# Patient Record
Sex: Male | Born: 1995 | Race: White | Hispanic: Yes | Marital: Single | Smoking: Never smoker
Health system: Southern US, Community
[De-identification: ages and names within clinical notes are randomized; demographics above are authoritative.]

## PROBLEM LIST (undated history)

## (undated) ENCOUNTER — Ambulatory Visit: Source: Home / Self Care

## (undated) DIAGNOSIS — J45909 Unspecified asthma, uncomplicated: Secondary | ICD-10-CM

## (undated) DIAGNOSIS — U071 COVID-19: Secondary | ICD-10-CM

## (undated) DIAGNOSIS — R238 Other skin changes: Secondary | ICD-10-CM

## (undated) HISTORY — DX: Other skin changes: R23.8

## (undated) HISTORY — DX: Unspecified asthma, uncomplicated: J45.909

## (undated) HISTORY — DX: COVID-19: U07.1

---
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
## 2015-01-15 ENCOUNTER — Ambulatory Visit
Admission: RE | Admit: 2015-01-15 | Discharge: 2015-01-15 | Disposition: A | Discharge status: Home / Self Care | Payer: PRIVATE HEALTH INSURANCE | Source: Ambulatory Visit | Attending: Occupational Medicine | Admitting: Occupational Medicine

## 2015-01-15 ENCOUNTER — Other Ambulatory Visit: Payer: Self-pay | Admitting: Occupational Medicine

## 2015-01-15 DIAGNOSIS — Z021 Encounter for pre-employment examination: Secondary | ICD-10-CM

## 2017-09-21 DIAGNOSIS — M25511 Pain in right shoulder: Secondary | ICD-10-CM | POA: Insufficient documentation

## 2018-08-18 ENCOUNTER — Ambulatory Visit (INDEPENDENT_AMBULATORY_CARE_PROVIDER_SITE_OTHER): Payer: Managed Care, Other (non HMO) | Admitting: Allergy

## 2018-08-18 ENCOUNTER — Encounter: Payer: Self-pay | Admitting: Allergy

## 2018-08-18 VITALS — BP 126/86 | HR 73 | Resp 16 | Ht 63.5 in | Wt 171.6 lb

## 2018-08-18 DIAGNOSIS — H1013 Acute atopic conjunctivitis, bilateral: Secondary | ICD-10-CM

## 2018-08-18 DIAGNOSIS — J3089 Other allergic rhinitis: Secondary | ICD-10-CM | POA: Insufficient documentation

## 2018-08-18 MED ORDER — FLUTICASONE PROPIONATE 50 MCG/ACT NA SUSP: 2.0000 | Freq: Every day | NASAL | 5 refills | Status: DC

## 2018-08-18 MED ORDER — MONTELUKAST SODIUM 10 MG PO TABS: 10.0000 mg | ORAL_TABLET | Freq: Every day | ORAL | 5 refills | Status: DC

## 2018-08-18 MED ORDER — AZELASTINE HCL 0.1 % NA SOLN: NASAL | 5 refills | Status: DC

## 2018-08-18 NOTE — Assessment & Plan Note (Signed)
Perennial rhinoconjunctivitis symptoms for the past 7 years which worsened during the spring and summer.  He has tried over-the-counter Claritin, Zyrtec and Flonase with some benefit.  No previous allergy testing or ENT work-up.  Today skin testing was positive to grass, tree, weed, ragweed, dust mites and mold.  Discussed environmental control measures.  Start Singulair 10mg  daily.  Start Flonase 2 sprays daily for nasal congestion.  Demonstrated proper nasal spray use.  Start azelastine 1-2 sprays twice a day as needed for runny nose.   May use over the counter antihistamines such as Zyrtec (cetirizine), Claritin (loratadine), Allegra (fexofenadine), or Xyzal (levocetirizine) daily as needed.  Nasal saline spray (i.e., Simply Saline) or nasal saline lavage (i.e., NeilMed) is recommended as needed and prior to medicated nasal sprays.  Had a detailed discussion with patient/family that clinical history is suggestive of allergic rhinitis, and may benefit from allergy immunotherapy (AIT). Discussed in detail regarding the dosing, schedule, side effects (mild to moderate local allergic reaction and rarely systemic allergic reactions including anaphylaxis), and benefits (significant improvement in nasal symptoms, seasonal flares of asthma) of immunotherapy with the patient. There is significant time commitment involved with allergy shots, which includes weekly immunotherapy injections for first 6 months and then biweekly to monthly injections for 3-5 years.   Patient would let me know if ready to start allergy injections.  If above regimen does not help his congestion symptoms then may also need an ENT evaluation as well.

## 2018-08-18 NOTE — Patient Instructions (Addendum)
Today's skin testing showed: positive to grass, tree, weeds, dust mites, ragweed, mold  Start Singulair 10mg  daily. Start Flonase 2 sprays daily for nasal congestion.  Start azelastine 1-2 sprays twice a day as needed for runny nose.  May use over the counter antihistamines such as Zyrtec (cetirizine), Claritin (loratadine), Allegra (fexofenadine), or Xyzal (levocetirizine) daily as needed.  Nasal saline spray (i.e., Simply Saline) or nasal saline lavage (i.e., NeilMed) is recommended as needed and prior to medicated nasal sprays.  Had a detailed discussion with patient/family that clinical history is suggestive of allergic rhinitis, and may benefit from allergy immunotherapy (AIT). Discussed in detail regarding the dosing, schedule, side effects (mild to moderate local allergic reaction and rarely systemic allergic reactions including anaphylaxis), and benefits (significant improvement in nasal symptoms, seasonal flares of asthma) of immunotherapy with the patient. There is significant time commitment involved with allergy shots, which includes weekly immunotherapy injections for first 6 months and then biweekly to monthly injections for 3-5 years.   Follow up in 2 months  Reducing Pollen Exposure . Pollen seasons: trees (spring), grass (summer) and ragweed/weeds (fall). Marland Kitchen Keep windows closed in your home and car to lower pollen exposure.  Lilian Kapur air conditioning in the bedroom and throughout the house if possible.  . Avoid going out in dry windy days - especially early morning. . Pollen counts are highest between 5 - 10 AM and on dry, hot and windy days.  . Save outside activities for late afternoon or after a heavy rain, when pollen levels are lower.  . Avoid mowing of grass if you have grass pollen allergy. Marland Kitchen Be aware that pollen can also be transported indoors on people and pets.  . Dry your clothes in an automatic dryer rather than hanging them outside where they might collect pollen.   . Rinse hair and eyes before bedtime. Control of House Dust Mite Allergen . Dust mite allergens are a common trigger of allergy and asthma symptoms. While they can be found throughout the house, these microscopic creatures thrive in warm, humid environments such as bedding, upholstered furniture and carpeting. . Because so much time is spent in the bedroom, it is essential to reduce mite levels there.  . Encase pillows, mattresses, and box springs in special allergen-proof fabric covers or airtight, zippered plastic covers.  . Bedding should be washed weekly in hot water (130 F) and dried in a hot dryer. Allergen-proof covers are available for comforters and pillows that can't be regularly washed.  Reyes Ivan the allergy-proof covers every few months. Minimize clutter in the bedroom. Keep pets out of the bedroom.  Marland Kitchen Keep humidity less than 50% by using a dehumidifier or air conditioning. You can buy a humidity measuring device called a hygrometer to monitor this.  . If possible, replace carpets with hardwood, linoleum, or washable area rugs. If that's not possible, vacuum frequently with a vacuum that has a HEPA filter. . Remove all upholstered furniture and non-washable window drapes from the bedroom. . Remove all non-washable stuffed toys from the bedroom.  Wash stuffed toys weekly. Mold Control Mold and fungi can grow on a variety of surfaces provided certain temperature and moisture conditions exist.  Outdoor molds grow on plants, decaying vegetation and soil. The major outdoor mold, Alternaria and Cladosporium, are found in very high numbers during hot and dry conditions. Generally, a late summer - fall peak is seen for common outdoor fungal spores. Rain will temporarily lower outdoor mold spore count, but counts  rise rapidly when the rainy period ends. The most important indoor molds are Aspergillus and Penicillium. Dark, humid and poorly ventilated basements are ideal sites for mold growth. The  next most common sites of mold growth are the bathroom and the kitchen. Outdoor (Seasonal) Mold Control Use air conditioning and keep windows closed. Avoid exposure to decaying vegetation. Avoid leaf raking. Avoid grain handling. Consider wearing a face mask if working in moldy areas.  Indoor (Perennial) Mold Control  Maintain humidity below 50%. Get rid of mold growth on hard surfaces with water, detergent and, if necessary, 5% bleach (do not mix with other cleaners). Then dry the area completely. If mold covers an area more than 10 square feet, consider hiring an indoor environmental professional. For clothing, washing with soap and water is best. If moldy items cannot be cleaned and dried, throw them away. Remove sources e.g. contaminated carpets. Repair and seal leaking roofs or pipes. Using dehumidifiers in damp basements may be helpful, but empty the water and clean units regularly to prevent mildew from forming. All rooms, especially basements, bathrooms and kitchens, require ventilation and cleaning to deter mold and mildew growth. Avoid carpeting on concrete or damp floors, and storing items in damp areas.

## 2018-08-18 NOTE — Progress Notes (Signed)
New Patient Note  RE: Ronald Vang MRN: [id number] DOB: 1995-08-24 Date of Office Visit: 08/18/2018  Referring provider: No ref. provider found Primary care provider: Patient, No Pcp Per  Chief Complaint: Allergic Rhinitis   History of Present Illness: I had the pleasure of seeing Ronald Vang for initial evaluation at the [facility] on 08/18/2018. He is a 23 y.o. male, who is self-referred here for the evaluation of allergic rhinitis. He is accompanied today by his girlfriend who provided/contributed to the history.   He reports symptoms of nasal congestion, sneezing, rhinorrhea, itchy nose, itchy/watery eyes and snoring at night. Symptoms have been going on for 7 years. The symptoms are present all year around with worsening in spring and summer. Other triggers include exposure to unknown. Anosmia: no. Headache: sometimes. He has used Careers adviserallegra, Claritin, zyrtec, Flonase with some improvement in symptoms. Sinus infections: no. Previous work up includes: no. Previous ENT evaluation: no. Previous sinus imaging: no.  Assessment and Plan: [name] is a 23 y.o. male with: Other allergic rhinitis Perennial rhinoconjunctivitis symptoms for the past 7 years which worsened during the spring and summer.  He has tried over-the-counter Claritin, Zyrtec and Flonase with some benefit.  No previous allergy testing or ENT work-up.  Today skin testing was positive to grass, tree, weed, ragweed, dust mites and mold.  Discussed environmental control measures.  Start Singulair 10mg  daily.  Start Flonase 2 sprays daily for nasal congestion.  Demonstrated proper nasal spray use.  Start azelastine 1-2 sprays twice a day as needed for runny nose.   May use over the counter antihistamines such as Zyrtec (cetirizine), Claritin (loratadine), Allegra (fexofenadine), or Xyzal (levocetirizine) daily as needed.  Nasal saline spray (i.e., Simply Saline) or nasal saline lavage (i.e., NeilMed)  is recommended as needed and prior to medicated nasal sprays.  Had a detailed discussion with patient/family that clinical history is suggestive of allergic rhinitis, and may benefit from allergy immunotherapy (AIT). Discussed in detail regarding the dosing, schedule, side effects (mild to moderate local allergic reaction and rarely systemic allergic reactions including anaphylaxis), and benefits (significant improvement in nasal symptoms, seasonal flares of asthma) of immunotherapy with the patient. There is significant time commitment involved with allergy shots, which includes weekly immunotherapy injections for first 6 months and then biweekly to monthly injections for 3-5 years.   Patient would let me know if ready to start allergy injections.  If above regimen does not help his congestion symptoms then may also need an ENT evaluation as well.  Allergic conjunctivitis of both eyes See assessment and plan as above.  Declines eyedrops at this time.  Return in about 2 months (around 10/17/2018).  Meds ordered this encounter  Medications  . montelukast (SINGULAIR) 10 MG tablet    Sig: Take 1 tablet (10 mg total) by mouth at bedtime.    Dispense:  30 tablet    Refill:  5  . fluticasone (FLONASE) 50 MCG/ACT nasal spray    Sig: Place 2 sprays into both nostrils daily.    Dispense:  16 g    Refill:  5  . azelastine (ASTELIN) 0.1 % nasal spray    Sig: Take 1-2 sprays twice a day as needed for runny nose.    Dispense:  30 mL    Refill:  5   Other allergy screening: Asthma: no Rhino conjunctivitis: yes Food allergy: no Medication allergy: no Hymenoptera allergy: no Urticaria: no Eczema:no History of recurrent infections suggestive of immunodeficency: no  Diagnostics: Skin Testing: Environmental allergy panel. Positive test to: grass, tree, weeds, dust mites, ragweed, mold.  Results discussed with patient/family. Airborne Adult Perc - 08/18/18 1349    Time Antigen Placed  1350     Allergen Manufacturer  Waynette Buttery    Location  Back    Number of Test  59    Panel 1  Select    1. Control-Buffer 50% Glycerol  Negative    2. Control-Histamine 1 mg/ml  3+    3. Albumin saline  Negative    4. Bahia  Negative    5. [place]  4+    6. Johnson  4+    7. Kentucky Blue  4+    8. Meadow Fescue  4+    9. Perennial Rye  4+    10. Sweet Vernal  4+    11. Timothy  4+    12. Cocklebur  Negative    13. Burweed Marshelder  Negative    14. Ragweed, short  Negative    15. Ragweed, Giant  Negative    16. Plantain,  English  4+    17. Lamb's Quarters  Negative    18. Sheep Sorrell  Negative    19. Rough Pigweed  Negative    20. Marsh Elder, Rough  Negative    21. Mugwort, Common  Negative    22. Ash mix  Negative    23. Birch mix  3+    24. Beech American  Negative    25. Box, Elder  Negative    26. Cedar, red  4+    27. Cottonwood, Guinea-Bissau  Negative    28. Elm mix  Negative    29. Hickory mix  4+    30. Maple mix  Negative    31. Oak, Guinea-Bissau mix  Negative    32. Pecan Pollen  4+    33. Pine mix  Negative    34. Sycamore Eastern  Negative    35. Walnut, Black Pollen  Negative    36. Alternaria alternata  Negative    37. Cladosporium Herbarum  Negative    38. Aspergillus mix  Negative    39. Penicillium mix  Negative    40. Bipolaris sorokiniana (Helminthosporium)  Negative    41. Drechslera spicifera (Curvularia)  Negative    42. Mucor plumbeus  Negative    43. Fusarium moniliforme  Negative    44. Aureobasidium pullulans (pullulara)  Negative    45. Rhizopus oryzae  Negative    46. Botrytis cinera  Negative    47. Epicoccum nigrum  Negative    48. Phoma betae  Negative    49. Candida Albicans  Negative    50. Trichophyton mentagrophytes  Negative    51. Mite, D Farinae  5,000 AU/ml  3+    52. Mite, D Pteronyssinus  5,000 AU/ml  3+    53. Cat Hair 10,000 BAU/ml  Negative    54.  Dog Epithelia  Negative    55. Mixed Feathers  Negative    56. Horse Epithelia   Negative    57. Cockroach, German  Negative    58. Mouse  Negative    59. Tobacco Leaf  Negative     Intradermal - 08/18/18 1433    Control  Negative    Ragweed mix  2+    Mold 1  Negative    Mold 2  2+    Mold 3  2+    Mold 4  2+  Cat  Negative    Dog  Negative    Cockroach  Negative       Past Medical History: Patient Active Problem List   Diagnosis Date Noted  . Other allergic rhinitis 08/18/2018  . Allergic conjunctivitis of both eyes 08/18/2018  . Routine general medical examination at a health care facility 01/17/2014  . Other general medical examination for administrative purposes 01/17/2014   History reviewed. No pertinent past medical history. Past Surgical History: History reviewed. No pertinent surgical history. Medication List:  Current Outpatient Medications  Medication Sig Dispense Refill  . cetirizine (ZYRTEC) 10 MG tablet Take 10 mg by mouth daily.    . fexofenadine (ALLEGRA) 180 MG tablet Take 180 mg by mouth daily.    Marland Kitchen. azelastine (ASTELIN) 0.1 % nasal spray Take 1-2 sprays twice a day as needed for runny nose. 30 mL 5  . fluticasone (FLONASE) 50 MCG/ACT nasal spray Place 2 sprays into both nostrils daily. 16 g 5  . montelukast (SINGULAIR) 10 MG tablet Take 1 tablet (10 mg total) by mouth at bedtime. 30 tablet 5   No current facility-administered medications for this visit.    Allergies: No Known Allergies Social History: Social History   Socioeconomic History  . Marital status: Single    Spouse name: Not on file  . Number of children: Not on file  . Years of education: Not on file  . Highest education level: Not on file  Occupational History  . Not on file  Social Needs  . Financial resource strain: Not on file  . Food insecurity:    Worry: Not on file    Inability: Not on file  . Transportation needs:    Medical: Not on file    Non-medical: Not on file  Tobacco Use  . Smoking status: Current Every Day Smoker    Types: Cigarettes  .  Smokeless tobacco: Never Used  Substance and Sexual Activity  . Alcohol use: Yes    Comment: social  . Drug use: No  . Sexual activity: Not on file  Lifestyle  . Physical activity:    Days per week: Not on file    Minutes per session: Not on file  . Stress: Not on file  Relationships  . Social connections:    Talks on phone: Not on file    Gets together: Not on file    Attends religious service: Not on file    Active member of club or organization: Not on file    Attends meetings of clubs or organizations: Not on file    Relationship status: Not on file  Other Topics Concern  . Not on file  Social History Narrative  . Not on file   Lives in a 23 year old home. Smoking: denies Occupation: Ecologistfirefighter  Environmental HistorySurveyor, minerals: Water Damage/mildew in the house: no Engineer, civil (consulting)Carpet in the family room: no Carpet in the bedroom: no Heating: gas and electric Cooling: central Pet: yes 2 dogs x 5 yrs  Family History: History reviewed. No pertinent family history. Problem                               Relation Asthma                                   No  Eczema  No  Food allergy                          No  Allergic rhino conjunctivitis     Mother   Review of Systems  Constitutional: Negative for appetite change, chills, fever and unexpected weight change.  HENT: Positive for congestion and rhinorrhea.   Eyes: Negative for itching.  Respiratory: Negative for cough, chest tightness, shortness of breath and wheezing.   Cardiovascular: Negative for chest pain.  Gastrointestinal: Negative for abdominal pain.  Genitourinary: Negative for difficulty urinating.  Skin: Negative for rash.  Allergic/Immunologic: Positive for environmental allergies. Negative for food allergies.  Neurological: Negative for headaches.   Objective: BP 126/86   Pulse 73   Resp 16   Ht 5' 3.5" (1.613 m)   Wt 171 lb 9.6 oz (77.8 kg)   SpO2 97%   BMI 29.92 kg/m  Body mass index  is 29.92 kg/m. Physical Exam  Constitutional: He is oriented to person, place, and time. He appears well-developed and well-nourished.  HENT:  Head: Normocephalic and atraumatic.  Right Ear: External ear normal.  Left Ear: External ear normal.  Nose: Mucosal edema and rhinorrhea present.  Mouth/Throat: Oropharynx is clear and moist.  Eyes: Conjunctivae and EOM are normal.  Neck: Neck supple.  Cardiovascular: Normal rate, regular rhythm and normal heart sounds. Exam reveals no gallop and no friction rub.  No murmur heard. Pulmonary/Chest: Effort normal and breath sounds normal. He has no wheezes. He has no rales.  Abdominal: Soft. Bowel sounds are normal. There is no abdominal tenderness.  Lymphadenopathy:    He has no cervical adenopathy.  Neurological: He is alert and oriented to person, place, and time.  Skin: Skin is warm. No rash noted.  Psychiatric: He has a normal mood and affect. His behavior is normal.  Nursing note and vitals reviewed.  The plan was reviewed with the patient/family, and all questions/concerned were addressed.  It was my pleasure to see Jonanthan today and participate in his care. Please feel free to contact me with any questions or concerns.  Sincerely,  Wyline Mood, DO Allergy & Immunology  [facility] office: [phone] [facility] office: [phone]

## 2018-10-17 ENCOUNTER — Ambulatory Visit: Payer: Managed Care, Other (non HMO) | Admitting: Allergy

## 2018-10-17 ENCOUNTER — Encounter: Payer: Self-pay | Admitting: Allergy

## 2018-10-17 ENCOUNTER — Other Ambulatory Visit: Payer: Self-pay

## 2018-10-17 VITALS — BP 110/70 | HR 64 | Resp 16 | Wt 171.0 lb

## 2018-10-17 DIAGNOSIS — H1010 Acute atopic conjunctivitis, unspecified eye: Secondary | ICD-10-CM | POA: Diagnosis not present

## 2018-10-17 DIAGNOSIS — J302 Other seasonal allergic rhinitis: Secondary | ICD-10-CM

## 2018-10-17 DIAGNOSIS — J3089 Other allergic rhinitis: Secondary | ICD-10-CM

## 2018-10-17 NOTE — Patient Instructions (Signed)
Other allergic rhinitis  Last skin testing was positive to grass, tree, weed, ragweed, dust mites and mold.    Continue environmental control measures.  Continue Singulair 10mg  daily - only once a day.   Continue Flonase 2 sprays daily for nasal congestion.    Start azelastine 1-2 sprays twice a day as needed for runny nose.   May use over the counter antihistamines such as Zyrtec (cetirizine), Claritin (loratadine), Allegra (fexofenadine), or Xyzal (levocetirizine) daily as needed. May take it twice a day if needed.   Nasal saline spray (i.e., Simply Saline) or nasal saline lavage (i.e., NeilMed) is recommended as needed and prior to medicated nasal sprays.  Let me know if ready to start allergy injections.  Let me know if you need eye drops.   Follow up in 4 months

## 2018-10-17 NOTE — Progress Notes (Signed)
Follow Up Note  RE: Demetrics Uren MRN: [id number] DOB: 20-Jun-1996 Date of Office Visit: 10/17/2018  Referring provider: No ref. provider found Primary care provider: Patient, No Pcp Per  Chief Complaint: Allergic Rhinitis   History of Present Illness: I had the pleasure of seeing Claudius Waldner for a follow up visit at the [facility] on 10/17/2018. He is a 23 y.o. male, who is being followed for allergic rhino conjunctivitis. Today he is here for regular follow up visit. His previous allergy office visit was on 08/18/2018 with Dr. Selena Batten.   Other allergic rhinitis Currently on Singulair daily, Claritin daily,  Flonase 2 sprays daily with no nosebleeds. Not needed to use azelastine nasal spray.   Using OTC eye drops with some benefit. Symptoms are fairly well controlled with above regimen.  Accidentally he has been doubling up on Singulair on bad days.  Noticed some increased symptoms after mowing the grass outdoors.  Assessment and Plan: Kijon is a 23 y.o. male with: Seasonal and perennial allergic rhinoconjunctivitis Past history - Perennial rhinoconjunctivitis symptoms for the past 7 years which worsened during the spring and summer.  He has tried over-the-counter Claritin, Zyrtec and Flonase with some benefit.  No previous allergy testing or ENT work-up. 2020 skin testing was positive to grass, tree, weed, ragweed, dust mites and mold.   Interim history - symptoms pretty well controlled with below regimen.   Continue environmental control measures.  Continue Singulair 10mg  daily - only once a day.   Continue Flonase 1-2 sprays daily for nasal congestion.    Start azelastine 1-2 sprays twice a day as needed for runny nose.   May use over the counter antihistamines such as Zyrtec (cetirizine), Claritin (loratadine), Allegra (fexofenadine), or Xyzal (levocetirizine) daily as needed. May take it twice a day if needed.   Nasal saline spray (i.e., Simply  Saline) or nasal saline lavage (i.e., NeilMed) is recommended as needed and prior to medicated nasal sprays.  Patient will let me know if ready to start allergy injections.  Declines Rx eye drops.   Return in about 4 months (around 02/16/2019).  Diagnostics: None.  Medication List:  Current Outpatient Medications  Medication Sig Dispense Refill  . azelastine (ASTELIN) 0.1 % nasal spray Take 1-2 sprays twice a day as needed for runny nose. 30 mL 5  . cetirizine (ZYRTEC) 10 MG tablet Take 10 mg by mouth daily.    . fexofenadine (ALLEGRA) 180 MG tablet Take 180 mg by mouth daily.    . fluticasone (FLONASE) 50 MCG/ACT nasal spray Place 2 sprays into both nostrils daily. 16 g 5  . montelukast (SINGULAIR) 10 MG tablet Take 1 tablet (10 mg total) by mouth at bedtime. 30 tablet 5   No current facility-administered medications for this visit.    Allergies: No Known Allergies I reviewed his past medical history, social history, family history, and environmental history and no significant changes have been reported from previous visit on 08/18/2018.  Review of Systems  Constitutional: Negative for appetite change, chills, fever and unexpected weight change.  HENT: Positive for congestion and rhinorrhea.   Eyes: Positive for itching.  Respiratory: Negative for cough, chest tightness, shortness of breath and wheezing.   Cardiovascular: Negative for chest pain.  Gastrointestinal: Negative for abdominal pain.  Genitourinary: Negative for difficulty urinating.  Skin: Negative for rash.  Allergic/Immunologic: Positive for environmental allergies. Negative for food allergies.  Neurological: Negative for headaches.   Objective: BP 110/70   Pulse  64   Resp 16   Wt 171 lb (77.6 kg)   SpO2 98%   BMI 29.82 kg/m  Body mass index is 29.82 kg/m. Physical Exam  Constitutional: He is oriented to person, place, and time. He appears well-developed and well-nourished.  HENT:  Head: Normocephalic  and atraumatic.  Right Ear: External ear normal.  Left Ear: External ear normal.  Nose: Mucosal edema and rhinorrhea present.  Mouth/Throat: Oropharynx is clear and moist.  Eyes: Conjunctivae and EOM are normal.  Neck: Neck supple.  Cardiovascular: Normal rate, regular rhythm and normal heart sounds. Exam reveals no gallop and no friction rub.  No murmur heard. Pulmonary/Chest: Effort normal and breath sounds normal. He has no wheezes. He has no rales.  Abdominal: Soft.  Neurological: He is alert and oriented to person, place, and time.  Skin: Skin is warm. No rash noted.  Psychiatric: He has a normal mood and affect. His behavior is normal.  Nursing note and vitals reviewed.  Previous notes and tests were reviewed. The plan was reviewed with the patient/family, and all questions/concerned were addressed.  It was my pleasure to see [name] today and participate in his care. Please feel free to contact me with any questions or concerns.  Sincerely,  Wyline MoodYoon Kim, DO Allergy & Immunology  [facility] [place] office: [phone] [facility] office: [phone]

## 2018-10-17 NOTE — Assessment & Plan Note (Signed)
Past history - Perennial rhinoconjunctivitis symptoms for the past 7 years which worsened during the spring and summer.  He has tried over-the-counter Claritin, Zyrtec and Flonase with some benefit.  No previous allergy testing or ENT work-up. 2020 skin testing was positive to grass, tree, weed, ragweed, dust mites and mold.   Interim history - symptoms pretty well controlled with below regimen.   Continue environmental control measures.  Continue Singulair 10mg  daily - only once a day.   Continue Flonase 1-2 sprays daily for nasal congestion.    Start azelastine 1-2 sprays twice a day as needed for runny nose.   May use over the counter antihistamines such as Zyrtec (cetirizine), Claritin (loratadine), Allegra (fexofenadine), or Xyzal (levocetirizine) daily as needed. May take it twice a day if needed.   Nasal saline spray (i.e., Simply Saline) or nasal saline lavage (i.e., NeilMed) is recommended as needed and prior to medicated nasal sprays.  Patient will let me know if ready to start allergy injections.  Declines Rx eye drops.

## 2019-02-20 ENCOUNTER — Ambulatory Visit: Payer: Managed Care, Other (non HMO) | Admitting: Allergy

## 2019-02-20 NOTE — Progress Notes (Deleted)
Follow Up Note  RE: Ronald Vang Basurto MRN: [id number] DOB: [birth year] Date of Office Visit: 02/20/2019  Referring provider: No ref. provider found Primary care provider: Patient, No Pcp Per  Chief Complaint: No chief complaint on file.  History of Present Illness: I had the pleasure of seeing Ronald Vang for a follow up visit at the [facility] on 02/20/2019. He is a 23 y.o. male, who is being followed for allergic rhinoconjunctivitis. Today he is here for regular follow up visit. His previous allergy office visit was on 10/17/2018 with Dr. Selena BattenKim.   Seasonal and perennial allergic rhinoconjunctivitis Past history - Perennial rhinoconjunctivitis symptoms for the past 7 years which worsened during the spring and summer.  He has tried over-the-counter Claritin, Zyrtec and Flonase with some benefit.  No previous allergy testing or ENT work-up. 2020 skin testing was positive to grass, tree, weed, ragweed, dust mites and mold.   Interim history - symptoms pretty well controlled with below regimen.   Continue environmental control measures.  Continue Singulair 10mg  daily - only once a day.   Continue Flonase 1-2 sprays daily for nasal congestion.  Start azelastine 1-2 sprays twice a day as needed for runny nose.   May use over the counter antihistamines such as Zyrtec (cetirizine), Claritin (loratadine), Allegra (fexofenadine), or Xyzal (levocetirizine) daily as needed. May take it twice a day if needed.   Nasal saline spray (i.e., Simply Saline) or nasal saline lavage (i.e., NeilMed) is recommended as needed and prior to medicated nasal sprays.  Patient will let me know if ready to start allergy injections.  Declines Rx eye drops.   Return in about 4 months (around 02/16/2019).  Assessment and Plan: [name] is a 23 y.o. male with: No problem-specific Assessment & Plan notes found for this encounter.  No follow-ups on file.  No orders of the defined types were  placed in this encounter.  Lab Orders  No laboratory test(s) ordered today    Diagnostics: Spirometry:  Tracings reviewed. His effort: {Blank single:19197::"Good reproducible efforts.","It was hard to get consistent efforts and there is a question as to whether this reflects a maximal maneuver.","Poor effort, data can not be interpreted."} FVC: ***L FEV1: ***L, ***% predicted FEV1/FVC ratio: ***% Interpretation: {Blank single:19197::"Spirometry consistent with mild obstructive disease","Spirometry consistent with moderate obstructive disease","Spirometry consistent with severe obstructive disease","Spirometry consistent with possible restrictive disease","Spirometry consistent with mixed obstructive and restrictive disease","Spirometry uninterpretable due to technique","Spirometry consistent with normal pattern","No overt abnormalities noted given today's efforts"}.  Please see scanned spirometry results for details.  Skin Testing: {Blank single:19197::"Select foods","Environmental allergy panel","Environmental allergy panel and select foods","Food allergy panel","None","Deferred due to recent antihistamines use"}. Positive test to: ***. Negative test to: ***.  Results discussed with patient/family.   Medication List:  Current Outpatient Medications  Medication Sig Dispense Refill  . azelastine (ASTELIN) 0.1 % nasal spray Take 1-2 sprays twice a day as needed for runny nose. 30 mL 5  . cetirizine (ZYRTEC) 10 MG tablet Take 10 mg by mouth daily.    . fexofenadine (ALLEGRA) 180 MG tablet Take 180 mg by mouth daily.    . fluticasone (FLONASE) 50 MCG/ACT nasal spray Place 2 sprays into both nostrils daily. 16 g 5  . montelukast (SINGULAIR) 10 MG tablet Take 1 tablet (10 mg total) by mouth at bedtime. 30 tablet 5   No current facility-administered medications for this visit.    Allergies: No Known Allergies I reviewed his past medical history, social history, family history,  and  environmental history and no significant changes have been reported from previous visit on 10/17/2018.  Review of Systems  Constitutional: Negative for appetite change, chills, fever and unexpected weight change.  HENT: Positive for congestion and rhinorrhea.   Eyes: Positive for itching.  Respiratory: Negative for cough, chest tightness, shortness of breath and wheezing.   Cardiovascular: Negative for chest pain.  Gastrointestinal: Negative for abdominal pain.  Genitourinary: Negative for difficulty urinating.  Skin: Negative for rash.  Allergic/Immunologic: Positive for environmental allergies. Negative for food allergies.  Neurological: Negative for headaches.   Objective: There were no vitals taken for this visit. There is no height or weight on file to calculate BMI. Physical Exam  Constitutional: He is oriented to person, place, and time. He appears well-developed and well-nourished.  HENT:  Head: Normocephalic and atraumatic.  Right Ear: External ear normal.  Left Ear: External ear normal.  Nose: Mucosal edema and rhinorrhea present.  Mouth/Throat: Oropharynx is clear and moist.  Eyes: Conjunctivae and EOM are normal.  Neck: Neck supple.  Cardiovascular: Normal rate, regular rhythm and normal heart sounds. Exam reveals no gallop and no friction rub.  No murmur heard. Pulmonary/Chest: Effort normal and breath sounds normal. He has no wheezes. He has no rales.  Abdominal: Soft.  Neurological: He is alert and oriented to person, place, and time.  Skin: Skin is warm. No rash noted.  Psychiatric: He has a normal mood and affect. His behavior is normal.  Nursing note and vitals reviewed.  Previous notes and tests were reviewed. The plan was reviewed with the patient/family, and all questions/concerned were addressed.  It was my pleasure to see Ronald Vang today and participate in his care. Please feel free to contact me with any questions or concerns.  Sincerely,  Rexene Alberts, DO  Allergy & Immunology  [facility] office: [phone] [facility] office: [place] office: [phone]

## 2019-03-01 ENCOUNTER — Other Ambulatory Visit: Payer: Self-pay

## 2019-03-01 ENCOUNTER — Encounter: Payer: Self-pay | Admitting: Allergy

## 2019-03-01 ENCOUNTER — Ambulatory Visit: Payer: Managed Care, Other (non HMO) | Admitting: Allergy

## 2019-03-01 VITALS — BP 112/72 | HR 88 | Temp 98.4°F | Resp 17

## 2019-03-01 DIAGNOSIS — J3089 Other allergic rhinitis: Secondary | ICD-10-CM | POA: Diagnosis not present

## 2019-03-01 DIAGNOSIS — H1010 Acute atopic conjunctivitis, unspecified eye: Secondary | ICD-10-CM | POA: Diagnosis not present

## 2019-03-01 DIAGNOSIS — J302 Other seasonal allergic rhinitis: Secondary | ICD-10-CM | POA: Diagnosis not present

## 2019-03-01 NOTE — Patient Instructions (Addendum)
Seasonal and perennial allergic rhinoconjunctivitis Past history -  2020 skin testing was positive to grass, tree, weed, ragweed, dust mites and mold.    Continue environmental control measures.  Continue montelukast 10mg  daily at night.   Continue Flonase 1-2 sprays daily for nasal congestion as needed.   Continue azelastine 1-2 sprays twice a day as needed for runny nose.   May use over the counter antihistamines such as Zyrtec (cetirizine), Claritin (loratadine), Allegra (fexofenadine), or Xyzal (levocetirizine) daily as needed. May take it twice a day if needed.   Nasal saline spray (i.e., Simply Saline) or nasal saline lavage (i.e., NeilMed) is recommended as needed and prior to medicated nasal sprays.  Make sure you take all the above medications during the spring time.   Follow up in 6 months or sooner if needed.   Reducing Pollen Exposure . Pollen seasons: trees (spring), grass (summer) and ragweed/weeds (fall). Marland Kitchen Keep windows closed in your home and car to lower pollen exposure.  Susa Simmonds air conditioning in the bedroom and throughout the house if possible.  . Avoid going out in dry windy days - especially early morning. . Pollen counts are highest between 5 - 10 AM and on dry, hot and windy days.  . Save outside activities for late afternoon or after a heavy rain, when pollen levels are lower.  . Avoid mowing of grass if you have grass pollen allergy. Marland Kitchen Be aware that pollen can also be transported indoors on people and pets.  . Dry your clothes in an automatic dryer rather than hanging them outside where they might collect pollen.  . Rinse hair and eyes before bedtime.

## 2019-03-01 NOTE — Progress Notes (Signed)
Follow Up Note  RE: Ronald Vang MRN: [id number] DOB: [birth year] Date of Office Visit: 03/01/2019  Referring provider: No ref. provider found Primary care provider: Patient, No Pcp Per  Chief Complaint: Allergic Rhinitis   History of Present Illness: I had the pleasure of seeing Alando Colleran for a follow up visit at the [facility] on 03/01/2019. He is a 23 y.o. male, who is being followed for allergic rhinoconjunctivitis. Today he is here for regular follow up visit. His previous allergy office visit was on 10/17/2018 with Dr. Maudie Mercury.   Seasonal and perennial allergic rhinoconjunctivitis Currently on Singulair daily and azelastine as needed with good benefit. Symptoms usually worst in the spring.  Wants to see how his symptoms are during next spring before deciding whether to start allergy injections.   Assessment and Plan: Kingslee is a 23 y.o. male with: Seasonal and perennial allergic rhinoconjunctivitis Past history - Perennial rhinoconjunctivitis symptoms for the past 7 years which worsened during the spring and summer.  No previous allergy testing or ENT work-up. 2020 skin testing was positive to grass, tree, weed, ragweed, dust mites and mold.   Interim history - well controlled with Singulair and azelastine prn. Worst in the spring.   Continue environmental control measures.  Continue montelukast 10mg  daily at night.   Continue Flonase 1-2 sprays daily for nasal congestion as needed.   Continue azelastine 1-2 sprays twice a day as needed for runny nose.   May use over the counter antihistamines such as Zyrtec (cetirizine), Claritin (loratadine), Allegra (fexofenadine), or Xyzal (levocetirizine) daily as needed. May take it twice a day if needed.   Nasal saline spray (i.e., Simply Saline) or nasal saline lavage (i.e., NeilMed) is recommended as needed and prior to medicated nasal sprays.  Make sure you take all the above medications during the spring  time.   If above regimen not effective next spring then will discuss starting on allergy immunotherapy.   Return in about 6 months (around 08/29/2019).  Diagnostics: None.  Medication List:  Current Outpatient Medications  Medication Sig Dispense Refill  . azelastine (ASTELIN) 0.1 % nasal spray Take 1-2 sprays twice a day as needed for runny nose. 30 mL 5  . montelukast (SINGULAIR) 10 MG tablet Take 1 tablet (10 mg total) by mouth at bedtime. 30 tablet 5   No current facility-administered medications for this visit.    Allergies: No Known Allergies I reviewed his past medical history, social history, family history, and environmental history and no significant changes have been reported from previous visit on 10/17/2018.  Review of Systems  Constitutional: Negative for appetite change, chills, fever and unexpected weight change.  HENT: Negative for congestion and rhinorrhea.   Eyes: Negative for itching.  Respiratory: Negative for cough, chest tightness, shortness of breath and wheezing.   Cardiovascular: Negative for chest pain.  Gastrointestinal: Negative for abdominal pain.  Genitourinary: Negative for difficulty urinating.  Skin: Negative for rash.  Allergic/Immunologic: Positive for environmental allergies. Negative for food allergies.  Neurological: Negative for headaches.   Objective: BP 112/72 (BP Location: Left Arm, Patient Position: Sitting, Cuff Size: Normal)   Pulse 88   Temp 98.4 F (36.9 C) (Temporal)   Resp 17   SpO2 98%  There is no height or weight on file to calculate BMI. Physical Exam  Constitutional: He is oriented to person, place, and time. He appears well-developed and well-nourished.  HENT:  Head: Normocephalic and atraumatic.  Right Ear: External ear normal.  Left Ear: External ear normal.  Nose: Mucosal edema (on the left) present.  Mouth/Throat: Oropharynx is clear and moist.  Eyes: Conjunctivae and EOM are normal.  Neck: Neck supple.   Cardiovascular: Normal rate, regular rhythm and normal heart sounds. Exam reveals no gallop and no friction rub.  No murmur heard. Pulmonary/Chest: Effort normal and breath sounds normal. He has no wheezes. He has no rales.  Abdominal: Soft.  Neurological: He is alert and oriented to person, place, and time.  Skin: Skin is warm. No rash noted.  Psychiatric: He has a normal mood and affect. His behavior is normal.  Nursing note and vitals reviewed.  Previous notes and tests were reviewed. The plan was reviewed with the patient/family, and all questions/concerned were addressed.  It was my pleasure to see [name] today and participate in his care. Please feel free to contact me with any questions or concerns.  Sincerely,  Wyline MoodYoon Maikol Grassia, DO Allergy & Immunology  [facility] [place] office: [phone] [facility] office: [phone] [place] office: [phone]

## 2019-03-01 NOTE — Assessment & Plan Note (Signed)
Past history - Perennial rhinoconjunctivitis symptoms for the past 7 years which worsened during the spring and summer.  No previous allergy testing or ENT work-up. 2020 skin testing was positive to grass, tree, weed, ragweed, dust mites and mold.   Interim history - well controlled with Singulair and azelastine prn. Worst in the spring.   Continue environmental control measures.  Continue montelukast 10mg  daily at night.   Continue Flonase 1-2 sprays daily for nasal congestion as needed.   Continue azelastine 1-2 sprays twice a day as needed for runny nose.   May use over the counter antihistamines such as Zyrtec (cetirizine), Claritin (loratadine), Allegra (fexofenadine), or Xyzal (levocetirizine) daily as needed. May take it twice a day if needed.   Nasal saline spray (i.e., Simply Saline) or nasal saline lavage (i.e., NeilMed) is recommended as needed and prior to medicated nasal sprays.  Make sure you take all the above medications during the spring time.   If above regimen not effective next spring then will discuss starting on allergy immunotherapy.

## 2019-08-30 ENCOUNTER — Ambulatory Visit: Payer: Managed Care, Other (non HMO) | Admitting: Allergy

## 2020-01-09 ENCOUNTER — Telehealth: Payer: Self-pay | Admitting: Allergy

## 2020-01-09 MED ORDER — AZELASTINE HCL 0.1 % NA SOLN: NASAL | 0 refills | Status: DC

## 2020-01-09 MED ORDER — MONTELUKAST SODIUM 10 MG PO TABS: 10.0000 mg | ORAL_TABLET | Freq: Every day | ORAL | 0 refills | Status: DC

## 2020-01-09 NOTE — Telephone Encounter (Signed)
Patient called and made appointment for 01/29/2020. And needs to have singulair, astelin called into [facility]. [phone].

## 2020-01-09 NOTE — Telephone Encounter (Signed)
Courtesy refill sent and patient notified. 

## 2020-01-29 ENCOUNTER — Ambulatory Visit: Payer: Managed Care, Other (non HMO) | Admitting: Family

## 2020-01-31 ENCOUNTER — Emergency Department (HOSPITAL_COMMUNITY): Payer: Managed Care, Other (non HMO)

## 2020-01-31 ENCOUNTER — Emergency Department (HOSPITAL_COMMUNITY)
Admission: EM | Admit: 2020-01-31 | Discharge: 2020-01-31 | Disposition: A | Discharge status: Home / Self Care | Payer: Managed Care, Other (non HMO) | Attending: Emergency Medicine | Admitting: Emergency Medicine

## 2020-01-31 ENCOUNTER — Other Ambulatory Visit: Payer: Self-pay | Admitting: Infectious Diseases

## 2020-01-31 ENCOUNTER — Encounter (HOSPITAL_COMMUNITY): Payer: Self-pay | Admitting: Emergency Medicine

## 2020-01-31 ENCOUNTER — Telehealth: Payer: Self-pay | Admitting: Infectious Diseases

## 2020-01-31 DIAGNOSIS — R05 Cough: Secondary | ICD-10-CM | POA: Diagnosis present

## 2020-01-31 DIAGNOSIS — U071 COVID-19: Secondary | ICD-10-CM | POA: Insufficient documentation

## 2020-01-31 DIAGNOSIS — Z6830 Body mass index (BMI) 30.0-30.9, adult: Secondary | ICD-10-CM

## 2020-01-31 DIAGNOSIS — Z79899 Other long term (current) drug therapy: Secondary | ICD-10-CM | POA: Insufficient documentation

## 2020-01-31 LAB — CBC
HCT: 47.7 % (ref 39.0–52.0)
Hemoglobin: 16.5 g/dL (ref 13.0–17.0)
MCH: 30.1 pg (ref 26.0–34.0)
MCHC: 34.6 g/dL (ref 30.0–36.0)
MCV: 87 fL (ref 80.0–100.0)
Platelets: 122 10*3/uL — ABNORMAL LOW (ref 150–400)
RBC: 5.48 MIL/uL (ref 4.22–5.81)
RDW: 12.1 % (ref 11.5–15.5)
WBC: 5.4 10*3/uL (ref 4.0–10.5)
nRBC: 0 % (ref 0.0–0.2)

## 2020-01-31 LAB — BASIC METABOLIC PANEL
Anion gap: 15 (ref 5–15)
BUN: 12 mg/dL (ref 6–20)
CO2: 23 mmol/L (ref 22–32)
Calcium: 9.6 mg/dL (ref 8.9–10.3)
Chloride: 99 mmol/L (ref 98–111)
Creatinine, Ser: 1.25 mg/dL — ABNORMAL HIGH (ref 0.61–1.24)
GFR calc Af Amer: >60 mL/min (ref >60)
GFR calc non Af Amer: >60 mL/min (ref >60)
Glucose, Bld: 134 mg/dL — ABNORMAL HIGH (ref 70–99)
Potassium: 3.6 mmol/L (ref 3.5–5.1)
Sodium: 137 mmol/L (ref 135–145)

## 2020-01-31 MED ORDER — ONDANSETRON HCL 4 MG PO TABS
4.0000 mg | ORAL_TABLET | Freq: Four times a day (QID) | ORAL | 0 refills | Status: DC
Start: 2020-01-31 — End: 2022-01-22

## 2020-01-31 MED ORDER — BENZONATATE 100 MG PO CAPS: 100.0000 mg | ORAL_CAPSULE | Freq: Three times a day (TID) | ORAL | 0 refills | Status: AC

## 2020-01-31 MED ORDER — ACETAMINOPHEN 325 MG PO TABS
650.0000 mg | ORAL_TABLET | Freq: Once | ORAL | Status: AC
  Administered 2020-01-31: 650 mg via ORAL
  Filled 2020-01-31: qty 2

## 2020-01-31 NOTE — ED Provider Notes (Signed)
[facility] EMERGENCY DEPARTMENT Provider Note   CSN: [id number] Arrival date & time: 01/31/20  0013     History Chief Complaint  Patient presents with  . COVID+    Ronald Vang is a 24 y.o. male.  HPI   24 year old male with a history of seasonal allergies, allergic rhinitis tinnitus, who presents to the emergency department today for evaluation of Covid symptoms.  He is complaining of a cough, he is also had some episodes of vomiting and diarrhea.  He has some chest wall pain due to the cough.  He is also had some fevers and sweats.  States he became symptomatic a few days ago and was diagnosed with Covid as an outpatient.  He has not tried any interventions for his symptoms.  History reviewed. No pertinent past medical history.  Patient Active Problem List   Diagnosis Date Noted  . Seasonal and perennial allergic rhinoconjunctivitis 10/17/2018  . Other allergic rhinitis 08/18/2018  . Allergic conjunctivitis of both eyes 08/18/2018  . Routine general medical examination at a health care facility 01/17/2014  . Other general medical examination for administrative purposes 01/17/2014    History reviewed. No pertinent surgical history.     No family history on file.  Social History   Tobacco Use  . Smoking status: Never Smoker  . Smokeless tobacco: Never Used  Vaping Use  . Vaping Use: Never used  Substance Use Topics  . Alcohol use: Yes    Comment: social  . Drug use: No    Home Medications Prior to Admission medications   Medication Sig Start Date End Date Taking? Authorizing Provider  azelastine (ASTELIN) 0.1 % nasal spray Take 1-2 sprays twice a day as needed for runny nose. 01/09/20   Ellamae Sia, DO  benzonatate (TESSALON) 100 MG capsule Take 1 capsule (100 mg total) by mouth every 8 (eight) hours for 5 days. 01/31/20 02/05/20  Jonnatan Hanners S, PA-C  montelukast (SINGULAIR) 10 MG tablet Take 1 tablet (10 mg total) by mouth at bedtime. 01/09/20    Ellamae Sia, DO  ondansetron (ZOFRAN) 4 MG tablet Take 1 tablet (4 mg total) by mouth every 6 (six) hours. 01/31/20   Viola Placeres S, PA-C    Allergies    Patient has no known allergies.  Review of Systems   Review of Systems  Constitutional: Positive for chills and fever.  HENT: Positive for congestion, sinus pain and sore throat. Negative for ear pain.   Eyes: Negative for visual disturbance.  Respiratory: Positive for cough. Negative for shortness of breath.   Cardiovascular: Positive for chest pain (chest wall pain).  Gastrointestinal: Negative for abdominal pain, constipation, diarrhea, nausea and vomiting.  Genitourinary: Negative for dysuria and hematuria.  Musculoskeletal: Negative for back pain.  Skin: Negative for color change and rash.  Neurological: Negative for headaches.  All other systems reviewed and are negative.   Physical Exam Updated Vital Signs BP 123/80 (BP Location: Right Arm)   Pulse (!) 108   Temp 98.5 F (36.9 C) (Oral)   Resp 18   Ht 5\' 3"  (1.6 m)   Wt 77.1 kg   SpO2 96%   BMI 30.11 kg/m   Physical Exam Vitals and nursing note reviewed.  Constitutional:      Appearance: He is well-developed.  HENT:     Head: Normocephalic and atraumatic.  Eyes:     Conjunctiva/sclera: Conjunctivae normal.  Cardiovascular:     Rate and Rhythm: Normal rate and regular  rhythm.     Heart sounds: Normal heart sounds. No murmur heard.   Pulmonary:     Effort: Pulmonary effort is normal. No respiratory distress.     Breath sounds: Normal breath sounds. No wheezing, rhonchi or rales.  Abdominal:     General: Bowel sounds are normal.     Palpations: Abdomen is soft.     Tenderness: There is no abdominal tenderness. There is no guarding or rebound.  Musculoskeletal:     Cervical back: Neck supple.  Skin:    General: Skin is warm and dry.  Neurological:     Mental Status: He is alert.     ED Results / Procedures / Treatments   Labs (all labs ordered  are listed, but only abnormal results are displayed) Labs Reviewed  CBC - Abnormal; Notable for the following components:      Result Value   Platelets 122 (*)    All other components within normal limits  BASIC METABOLIC PANEL - Abnormal; Notable for the following components:   Glucose, Bld 134 (*)    Creatinine, Ser 1.25 (*)    All other components within normal limits    EKG None  Radiology DG Chest Portable 1 View  Result Date: 01/31/2020 CLINICAL DATA:  Diagnosed COVID positive this morning. Cough, fever, chills, body aches. EXAM: PORTABLE CHEST 1 VIEW COMPARISON:  01/15/2015 FINDINGS: The heart size and mediastinal contours are within normal limits. Both lungs are clear. The visualized skeletal structures are unremarkable. IMPRESSION: No active disease. Electronically Signed   By: Burman Nieves M.D.   On: 01/31/2020 00:57    Procedures Procedures (including critical care time)  Medications Ordered in ED Medications  acetaminophen (TYLENOL) tablet 650 mg (650 mg Oral Given 01/31/20 0029)    ED Course  I have reviewed the triage vital signs and the nursing notes.  Pertinent labs & imaging results that were available during my care of the patient were reviewed by me and considered in my medical decision making (see chart for details).    MDM Rules/Calculators/A&P                          Patient presenting for evaluation for Covid.  Reports symptoms ongoing for several days.  He tested positive at an outside clinic yesterday.  Patient nontoxic, well-appearing, no distress.  Vital signs are reassuring.  Chest x-ray neg for pna.  Labs reviewed and showed mild thrombocytopenia, no leukocytosis.  No significant electrolyte derangement.  Creatinine is essentially normal.. Advised on quarantine measures. Will give Rx for symptomatic management. Advised on f/u and return precautions. Pt voiced understanding of the plan and reasons to return. All questions answered, pt stable for  d/c.  Final Clinical Impression(s) / ED Diagnoses Final diagnoses:  COVID-19    Rx / DC Orders ED Discharge Orders         Ordered    benzonatate (TESSALON) 100 MG capsule  Every 8 hours     Discontinue  Reprint     01/31/20 1209    ondansetron (ZOFRAN) 4 MG tablet  Every 6 hours     Discontinue  Reprint     01/31/20 [address], Zinnia Tindall S, PA-C 01/31/20 1215    Alvira Monday, MD 02/10/20 0050

## 2020-01-31 NOTE — Telephone Encounter (Signed)
Called to discuss with patient about Covid symptoms and the use of Regeneron, a monoclonal antibody infusion for those with mild to moderate Covid symptoms and at a high risk of hospitalization.  Pt is qualified for this infusion at the [facility] infusion [facility] due to [employer].    The address for the infusion clinic site is:  --GPS address is [address] - the parking is located near [employer] building where you will see  COVID19 Infusion feather banner marking the entrance to parking.   (see photos below)            --Enter into the 2nd entrance where the "wave, flag banner" is at the road. Turn into this 2nd entrance and immediately turn left to park in 1 of the 5 parking spots. Please stay in your car and call the desk for assistance inside [phone].   --Average time in department is roughly 3 hours for Regeneron treatment - this includes preparation of the medication, IV start and the required 1 hour monitoring after the infusion.    Should you develop worsening shortness of breath, chest pain or severe breathing problems please do not wait for this appointment and go to the Emergency room for evaluation and treatment.   The day of your visit you should: Marland Kitchen Get plenty of rest the night before and drink plenty of water . Eat a light meal/snack before coming and take your medications as prescribed  . Wear warm, comfortable clothes with a shirt that can roll-up over the elbow (will need IV start).  . Wear a mask  . Consider bringing some activity to help pass the time  We are starting to see some insurances bill for the administration of the medication - we are learning more information but you may receive a bill after your appointment. It has ranged from $300-640. We can get you in touch with Customer Service team for billing to help if you incur a bill.   I hope this helps find you feeling better,  Rexene Alberts, NP

## 2020-01-31 NOTE — Discharge Instructions (Addendum)
You have been referred to the COVID 19 infusion clinic to receive an infusion which could help you with COVID. They should be reaching out to you in regards to setting up an appointment for this.   You were also given medications to help with your symptoms.   Please follow up with your primary care provider within 5-7 days for re-evaluation of your symptoms. If you do not have a primary care provider, information for a healthcare clinic has been provided for you to make arrangements for follow up care. Please return to the emergency department for any new or worsening symptoms.

## 2020-01-31 NOTE — ED Triage Notes (Signed)
BIB EMS from home. Patient diagnosed with COVID this AM. Reports cough, fever/chills, body aches. Febrile in triage.

## 2020-01-31 NOTE — Progress Notes (Signed)
I connected by phone with Ronald Vang on 01/31/2020 at 2:05 PM to discuss the potential use of a new treatment for mild to moderate COVID-19 viral infection in non-hospitalized patients.  This patient is a 24 y.o. male that meets the FDA criteria for Emergency Use Authorization of COVID monoclonal antibody casirivimab/imdevimab.  Has a (+) direct SARS-CoV-2 viral test result  Has mild or moderate COVID-19   Is NOT hospitalized due to COVID-19  Is within 10 days of symptom onset  Has at least one of the high risk factor(s) for progression to severe COVID-19 and/or hospitalization as defined in EUA.  Specific high risk criteria : BMI > 25   I have spoken and communicated the following to the patient or parent/caregiver regarding COVID monoclonal antibody treatment:  1. FDA has authorized the emergency use for the treatment of mild to moderate COVID-19 in adults and pediatric patients with positive results of direct SARS-CoV-2 viral testing who are 88 years of age and older weighing at least 40 kg, and who are at high risk for progressing to severe COVID-19 and/or hospitalization.  2. The significant known and potential risks and benefits of COVID monoclonal antibody, and the extent to which such potential risks and benefits are unknown.  3. Information on available alternative treatments and the risks and benefits of those alternatives, including clinical trials.  4. Patients treated with COVID monoclonal antibody should continue to self-isolate and use infection control measures (e.g., wear mask, isolate, social distance, avoid sharing personal items, clean and disinfect "high touch" surfaces, and frequent handwashing) according to CDC guidelines.   5. The patient or parent/caregiver has the option to accept or refuse COVID monoclonal antibody treatment.  After reviewing this information with the patient, The patient agreed to proceed with receiving casirivimab\imdevimab infusion and  will be provided a copy of the Fact sheet prior to receiving the infusion. Rexene Alberts 01/31/2020 2:05 PM

## 2020-01-31 NOTE — ED Notes (Signed)
Patient verbalizes understanding of discharge instructions. Opportunity for questioning and answers were provided. Armband removed by staff, pt discharged from ED.  

## 2020-02-01 MED ORDER — SODIUM CHLORIDE 0.9 % IV SOLN
Freq: Once | INTRAVENOUS | Status: AC
  Filled 2020-02-01: qty 600

## 2020-02-02 ENCOUNTER — Ambulatory Visit (HOSPITAL_COMMUNITY)
Admission: RE | Admit: 2020-02-02 | Discharge: 2020-02-02 | Disposition: A | Discharge status: Home / Self Care | Payer: Managed Care, Other (non HMO) | Source: Ambulatory Visit | Attending: Pulmonary Disease | Admitting: Pulmonary Disease

## 2020-02-02 DIAGNOSIS — Z6825 Body mass index (BMI) 25.0-25.9, adult: Secondary | ICD-10-CM | POA: Insufficient documentation

## 2020-02-02 DIAGNOSIS — U071 COVID-19: Secondary | ICD-10-CM | POA: Diagnosis present

## 2020-02-02 DIAGNOSIS — Z6830 Body mass index (BMI) 30.0-30.9, adult: Secondary | ICD-10-CM

## 2020-02-02 MED ORDER — METHYLPREDNISOLONE SODIUM SUCC 125 MG IJ SOLR: 125.0000 mg | Freq: Once | INTRAMUSCULAR | Status: DC | PRN

## 2020-02-02 MED ORDER — DIPHENHYDRAMINE HCL 50 MG/ML IJ SOLN: 50.0000 mg | Freq: Once | INTRAMUSCULAR | Status: DC | PRN

## 2020-02-02 MED ORDER — SODIUM CHLORIDE 0.9 % IV SOLN: INTRAVENOUS | Status: DC | PRN

## 2020-02-02 MED ORDER — FAMOTIDINE IN NACL 20-0.9 MG/50ML-% IV SOLN: 20.0000 mg | Freq: Once | INTRAVENOUS | Status: DC | PRN

## 2020-02-02 MED ORDER — ALBUTEROL SULFATE HFA 108 (90 BASE) MCG/ACT IN AERS: 2.0000 | INHALATION_SPRAY | Freq: Once | RESPIRATORY_TRACT | Status: DC | PRN

## 2020-02-02 MED ORDER — EPINEPHRINE 0.3 MG/0.3ML IJ SOAJ: 0.3000 mg | Freq: Once | INTRAMUSCULAR | Status: DC | PRN

## 2020-02-02 NOTE — Discharge Instructions (Signed)

## 2020-02-02 NOTE — Progress Notes (Signed)
  Diagnosis: COVID-19  Physician:dr wright   Procedure: Covid Infusion Clinic Med: casirivimab\imdevimab infusion - Provided patient with casirivimab\imdevimab fact sheet for patients, parents and caregivers prior to infusion.  Complications: No immediate complications noted.  Discharge: Discharged home   Ronald Vang 02/02/2020   

## 2020-02-03 ENCOUNTER — Other Ambulatory Visit: Payer: Self-pay | Admitting: Allergy

## 2020-02-15 NOTE — Patient Instructions (Addendum)
Seasonal and perennial allergic rhinoconjuncititis (2020 skin test was positive to grass, tree, weeds, ragweed, dust mite and mold) Continue montelukast 10 mg once a day Continue Flonase nasal spray 1-2 sprays each nostril once a day as needed for stuffy nose Continue azelastine nasal spray- using 1-2 sprays each nostril twice a day as needed for runny nose May use saline nasal spray or saline nasal rinse as needed for nasal symptoms. Please use this prior to any medicated nasal sprays.  Please let us know if this treatment plan is not working well for you. Schedule follow up appointment in 6 months

## 2020-02-16 ENCOUNTER — Encounter: Payer: Self-pay | Admitting: Family

## 2020-02-16 ENCOUNTER — Other Ambulatory Visit: Payer: Self-pay

## 2020-02-16 ENCOUNTER — Ambulatory Visit: Payer: Managed Care, Other (non HMO) | Admitting: Family

## 2020-02-16 VITALS — BP 112/60 | HR 81 | Temp 98.4°F | Resp 17 | Ht 64.0 in | Wt 174.4 lb

## 2020-02-16 DIAGNOSIS — J302 Other seasonal allergic rhinitis: Secondary | ICD-10-CM | POA: Diagnosis not present

## 2020-02-16 DIAGNOSIS — J3089 Other allergic rhinitis: Secondary | ICD-10-CM | POA: Diagnosis not present

## 2020-02-16 DIAGNOSIS — H1013 Acute atopic conjunctivitis, bilateral: Secondary | ICD-10-CM

## 2020-02-16 DIAGNOSIS — H1010 Acute atopic conjunctivitis, unspecified eye: Secondary | ICD-10-CM

## 2020-02-16 NOTE — Progress Notes (Signed)
[address] [place] [address] Dept: [phone]  FOLLOW UP NOTE  Patient ID: Ronald Vang, male    DOB: [birth year]  Age: 24 y.o. MRN: [id number] Date of Office Visit: 02/16/2020  Assessment  Chief Complaint: Allergic Rhinitis  (sneezing, congestion,itchy eyes, itchy skin-needs refills )  HPI Ronald Vang is a 24 year old male who presents for follow-up of seasonal and perennial allergic rhinoconjunctivitis.  He was last seen on March 01, 2019 by Dr. Selena Batten.  Seasonal and perennial allergic rhinitis is reported as moderately controlled with the use of montelukast 10 mg once a day as needed, Flonase 1 to 2 sprays each nostril once a day as needed, and an over-the-counter antihistamine as needed.  He is currently not using azelastine nasal spray.  He reports occasional nasal congestion and denies any rhinorrhea, postnasal drip, and sneezing.  He will use an over-the-counter antihistamine when mowing outside or being outside for extended period of time and this will help with his symptoms.  Allergic conjunctivitis is reported as moderately controlled with an over-the-counter antihistamine eyedrop.  He reports that at the beginning of this month he was diagnosed with COVID-19 and received a monoclonal antibody infusion and the next day felt good.  Current medications are as listed in the chart.   Drug Allergies:  No Known Allergies  Review of Systems: Review of Systems  Constitutional: Negative for chills and fever.  HENT: Positive for congestion.        Reports occasional congestion, denies rhinorrhea, post nasal drip and sneezing  Eyes:       Reports occasional itchy watery eyes   Respiratory: Negative for cough, shortness of breath and wheezing.   Cardiovascular: Negative for chest pain and palpitations.  Gastrointestinal: Negative for abdominal pain and heartburn.  Genitourinary: Negative for dysuria.  Skin: Negative for itching and rash.  Neurological:  Negative for headaches.  Endo/Heme/Allergies: Positive for environmental allergies.    Physical Exam: BP 112/60 (BP Location: Left Arm, Patient Position: Sitting, Cuff Size: Normal)   Pulse 81   Temp 98.4 F (36.9 C) (Temporal)   Resp 17   Ht 5\' 4"  (1.626 m)   Wt 174 lb 6.4 oz (79.1 kg)   SpO2 95%   BMI 29.94 kg/m    Physical Exam Constitutional:      Appearance: Normal appearance.  HENT:     Head: Normocephalic and atraumatic.     Comments: Pharynx normal. Eyes normal. Ears normal. Nose normal    Right Ear: Tympanic membrane, ear canal and external ear normal.     Left Ear: Tympanic membrane, ear canal and external ear normal.     Nose: Nose normal.     Mouth/Throat:     Mouth: Mucous membranes are moist.     Pharynx: Oropharynx is clear.  Eyes:     Conjunctiva/sclera: Conjunctivae normal.  Cardiovascular:     Rate and Rhythm: Regular rhythm.     Heart sounds: Normal heart sounds.  Pulmonary:     Effort: Pulmonary effort is normal.     Breath sounds: Normal breath sounds.     Comments: Lungs clear to auscultation Musculoskeletal:     Cervical back: Neck supple.  Skin:    General: Skin is warm.  Neurological:     Mental Status: He is alert and oriented to person, place, and time.  Psychiatric:        Mood and Affect: Mood normal.        Behavior: Behavior normal.  Thought Content: Thought content normal.        Judgment: Judgment normal.     Diagnostics:  None  Assessment and Plan: 1. Seasonal and perennial allergic rhinoconjunctivitis   2. Allergic conjunctivitis of both eyes     No orders of the defined types were placed in this encounter.   Patient Instructions  Seasonal and perennial allergic rhinoconjuncititis (2020 skin test was positive to grass, tree, weeds, ragweed, dust mite and mold) Continue montelukast 10 mg once a day Continue Flonase nasal spray 1-2 sprays each nostril once a day as needed for stuffy nose Continue azelastine nasal  spray- using 1-2 sprays each nostril twice a day as needed for runny nose May use saline nasal spray or saline nasal rinse as needed for nasal symptoms. Please use this prior to any medicated nasal sprays.  Please let us know if this treatment plan is not working well for you. Schedule follow up appointment in 6 months   Return in about 6 months (around 08/18/2020), or if symptoms worsen or fail to improve.    Thank you for the opportunity to care for this patient.  Please do not hesitate to contact me with questions.  Nehemiah Settle, FNP [facility]

## 2020-02-20 ENCOUNTER — Other Ambulatory Visit: Payer: Self-pay | Admitting: Allergy

## 2020-08-19 ENCOUNTER — Ambulatory Visit: Payer: Managed Care, Other (non HMO) | Admitting: Allergy

## 2020-09-24 NOTE — Progress Notes (Signed)
Follow Up Note  RE: Ronald Vang MRN: [id number] DOB: May 27, 1996 Date of Office Visit: 09/25/2020  Referring provider: No ref. provider found Primary care provider: Patient, No Pcp Per (Inactive)  Chief Complaint: Allergic Rhinitis  (Itchy eyes, congestion, sneezing, runny nose )  History of Present Illness: I had the pleasure of seeing Ronald Vang for a [facility]  on 09/25/[facility]. Ronald Vang is a 25 y.o. male, who is being followed for allergic rhinoconjunctivitis. His previous allergy office visit was on 02/16/2020 with Nehemiah Settle FNP. Today is a regular follow up visit.  Seasonal and perennial allergic rhinoconjunctivitis Currently taking montelukast daily, zyrtec 10mg  daily, Flonase 2 sprays per nostril daily. Some rhinorrhea but symptoms manageable.  Using some type of OTC eye drops with good benefit.   No nosebleeds.  Happy with current regimen.  Assessment and Plan: Ronald Vang is a 25 y.o. male with: Seasonal and perennial allergic rhinoconjunctivitis Past history - Perennial rhinoconjunctivitis symptoms for the past 7 years which worsened during the spring and summer. 2020 skin testing was positive to grass, tree, weed, ragweed, dust mites and mold.   Interim history - stable with below regimen.   Continue environmental control measures.  Continue montelukast 10mg  daily at night.   Continue Flonase 1-2 sprays daily for nasal congestion as needed.   May use azelastine nasal spray 1-2 sprays per nostril twice a day as needed for runny nose/drainage.  May use over the counter antihistamines such as Zyrtec (cetirizine), Claritin (loratadine), Allegra (fexofenadine), or Xyzal (levocetirizine) daily as needed. May take it twice a day if needed.   Nasal saline spray (i.e., Simply Saline) or nasal saline lavage (i.e., NeilMed) is recommended as needed and prior to medicated nasal sprays.  May use OTC eye drops.   If you have issues with  your eyes and want a prescription medication then let 1976 know.  Make sure you take all the above medications during the spring time.   If above regimen not effective then will discuss starting on allergy immunotherapy.   Return in about 1 year (around 09/25/2021).  Meds ordered this encounter  Medications  . montelukast (SINGULAIR) 10 MG tablet    Sig: TAKE 1 TABLET BY MOUTH EVERYDAY AT BEDTIME    Dispense:  90 tablet    Refill:  3  . fluticasone (FLONASE) 50 MCG/ACT nasal spray    Sig: Place 2 sprays into both nostrils daily.    Dispense:  48 g    Refill:  3   Lab Orders  No laboratory test(s) ordered today    Diagnostics: None.   Medication List:  Current Outpatient Medications  Medication Sig Dispense Refill  . azelastine (ASTELIN) 0.1 % nasal spray TAKE 1-2 SPRAYS TWICE A DAY AS NEEDED FOR RUNNY NOSE. 90 mL 1  . fluticasone (FLONASE) 50 MCG/ACT nasal spray Place 2 sprays into both nostrils daily. 48 g 3  . ondansetron (ZOFRAN) 4 MG tablet Take 1 tablet (4 mg total) by mouth every 6 (six) hours. 12 tablet 0  . montelukast (SINGULAIR) 10 MG tablet TAKE 1 TABLET BY MOUTH EVERYDAY AT BEDTIME 90 tablet 3   No current facility-administered medications for this visit.   Allergies: No Known Allergies I reviewed his past medical history, social history, family history, and environmental history and no significant changes have been reported from his previous visit.  Review of Systems  Constitutional: Negative for appetite change, chills, fever and unexpected weight change.  HENT:  Negative for congestion and rhinorrhea.   Eyes: Negative for itching.  Respiratory: Negative for cough, chest tightness, shortness of breath and wheezing.   Cardiovascular: Negative for chest pain.  Gastrointestinal: Negative for abdominal pain.  Genitourinary: Negative for difficulty urinating.  Skin: Negative for rash.  Allergic/Immunologic: Positive for environmental allergies. Negative for food  allergies.  Neurological: Negative for headaches.   Objective: BP 118/80   Pulse (!) 58   Temp 98.4 F (36.9 C)   Resp 18   Ht 5\' 3"  (1.6 m)   Wt 178 lb (80.7 kg)   SpO2 96%   BMI 31.53 kg/m  Body mass index is 31.53 kg/m. Physical Exam Vitals and nursing note reviewed.  Constitutional:      Appearance: Normal appearance. Ronald Vang is well-developed.  HENT:     Head: Normocephalic and atraumatic.     Right Ear: Tympanic membrane and external ear normal.     Left Ear: Tympanic membrane and external ear normal.     Nose: Nose normal.     Mouth/Throat:     Mouth: Mucous membranes are moist.     Pharynx: Oropharynx is clear.  Eyes:     Conjunctiva/sclera: Conjunctivae normal.  Cardiovascular:     Rate and Rhythm: Normal rate and regular rhythm.     Heart sounds: Normal heart sounds. No murmur heard.   Pulmonary:     Effort: Pulmonary effort is normal.     Breath sounds: Normal breath sounds. No wheezing, rhonchi or rales.  Musculoskeletal:     Cervical back: Neck supple.  Skin:    General: Skin is warm.     Findings: No rash.  Neurological:     Mental Status: Ronald Vang is alert and oriented to person, place, and time.  Psychiatric:        Behavior: Behavior normal.    Previous notes and tests were reviewed. The plan was reviewed with the patient/family, and all questions/concerned were addressed.  It was my pleasure to see Ronald Vang today and participate in his care. Please feel free to contact me with any questions or concerns.  Sincerely,  Rexford Maus, DO Allergy & Immunology  [facility] office: [phone] [facility] office: [phone]

## 2020-09-25 ENCOUNTER — Ambulatory Visit (INDEPENDENT_AMBULATORY_CARE_PROVIDER_SITE_OTHER): Payer: Managed Care, Other (non HMO) | Admitting: Allergy

## 2020-09-25 ENCOUNTER — Other Ambulatory Visit: Payer: Self-pay

## 2020-09-25 ENCOUNTER — Encounter: Payer: Self-pay | Admitting: Allergy

## 2020-09-25 VITALS — BP 118/80 | HR 58 | Temp 98.4°F | Resp 18 | Ht 63.0 in | Wt 178.0 lb

## 2020-09-25 DIAGNOSIS — J3089 Other allergic rhinitis: Secondary | ICD-10-CM | POA: Diagnosis not present

## 2020-09-25 DIAGNOSIS — J302 Other seasonal allergic rhinitis: Secondary | ICD-10-CM | POA: Diagnosis not present

## 2020-09-25 DIAGNOSIS — H1010 Acute atopic conjunctivitis, unspecified eye: Secondary | ICD-10-CM

## 2020-09-25 DIAGNOSIS — H1013 Acute atopic conjunctivitis, bilateral: Secondary | ICD-10-CM

## 2020-09-25 MED ORDER — MONTELUKAST SODIUM 10 MG PO TABS: ORAL_TABLET | ORAL | 3 refills | Status: DC

## 2020-09-25 MED ORDER — FLUTICASONE PROPIONATE 50 MCG/ACT NA SUSP
2.0000 | Freq: Every day | NASAL | 3 refills | Status: DC
Start: 2020-09-25 — End: 2021-08-25

## 2020-09-25 NOTE — Patient Instructions (Addendum)
Seasonal and perennial allergic rhinoconjunctivitis Past history -  2020 skin testing was positive to grass, tree, weed, ragweed, dust mites and mold.    Continue environmental control measures.  Continue montelukast 10mg  daily at night.   Continue Flonase 1-2 sprays daily for nasal congestion as needed.   May use azelastine nasal spray 1-2 sprays per nostril twice a day as needed for runny nose/drainage.    May use over the counter antihistamines such as Zyrtec (cetirizine), Claritin (loratadine), Allegra (fexofenadine), or Xyzal (levocetirizine) daily as needed. May take it twice a day if needed.   Nasal saline spray (i.e., Simply Saline) or nasal saline lavage (i.e., NeilMed) is recommended as needed and prior to medicated nasal sprays.  If you have issues with your eyes and want a prescription medication then let know.   Make sure you take all the above medications during the spring time.   Follow up in 12 months or sooner if needed.   Reducing Pollen Exposure . Pollen seasons: trees (spring), grass (summer) and ragweed/weeds (fall). 11-05-1980 Keep windows closed in your home and car to lower pollen exposure.  Marland Kitchen air conditioning in the bedroom and throughout the house if possible.  . Avoid going out in dry windy days - especially early morning. . Pollen counts are highest between 5 - 10 AM and on dry, hot and windy days.  . Save outside activities for late afternoon or after a heavy rain, when pollen levels are lower.  . Avoid mowing of grass if you have grass pollen allergy. Lilian Kapur Be aware that pollen can also be transported indoors on people and pets.  . Dry your clothes in an automatic dryer rather than hanging them outside where they might collect pollen.  . Rinse hair and eyes before bedtime.

## 2020-09-25 NOTE — Assessment & Plan Note (Signed)
Past history - Perennial rhinoconjunctivitis symptoms for the past 7 years which worsened during the spring and summer. 2020 skin testing was positive to grass, tree, weed, ragweed, dust mites and mold.   Interim history - stable with below regimen.   Continue environmental control measures.  Continue montelukast 10mg  daily at night.   Continue Flonase 1-2 sprays daily for nasal congestion as needed.   May use azelastine nasal spray 1-2 sprays per nostril twice a day as needed for runny nose/drainage.  May use over the counter antihistamines such as Zyrtec (cetirizine), Claritin (loratadine), Allegra (fexofenadine), or Xyzal (levocetirizine) daily as needed. May take it twice a day if needed.   Nasal saline spray (i.e., Simply Saline) or nasal saline lavage (i.e., NeilMed) is recommended as needed and prior to medicated nasal sprays.  May use OTC eye drops.   If you have issues with your eyes and want a prescription medication then let know.  Make sure you take all the above medications during the spring time.   If above regimen not effective then will discuss starting on allergy immunotherapy.

## 2021-03-27 ENCOUNTER — Telehealth: Payer: Self-pay | Admitting: Allergy

## 2021-03-27 MED ORDER — MONTELUKAST SODIUM 10 MG PO TABS: ORAL_TABLET | ORAL | 3 refills | Status: DC

## 2021-03-27 NOTE — Telephone Encounter (Signed)
Sent in refill to [facility] on [address] notified patient

## 2021-03-27 NOTE — Telephone Encounter (Signed)
Patient called and needs to have singulair called into [facility] [phone]./

## 2021-08-25 ENCOUNTER — Other Ambulatory Visit: Payer: Self-pay | Admitting: *Deleted

## 2021-08-25 MED ORDER — FLUTICASONE PROPIONATE 50 MCG/ACT NA SUSP: 2.0000 | Freq: Every day | NASAL | 1 refills | Status: DC

## 2021-12-31 IMAGING — DX DG CHEST 1V PORT
1 series · 1 of 1 positions shown · non-contrast
Comparison: 01/15/2015

CLINICAL DATA: Diagnosed COVID positive this morning. Cough, fever,
chills, body aches.

EXAM:
PORTABLE CHEST 1 VIEW

[chest]
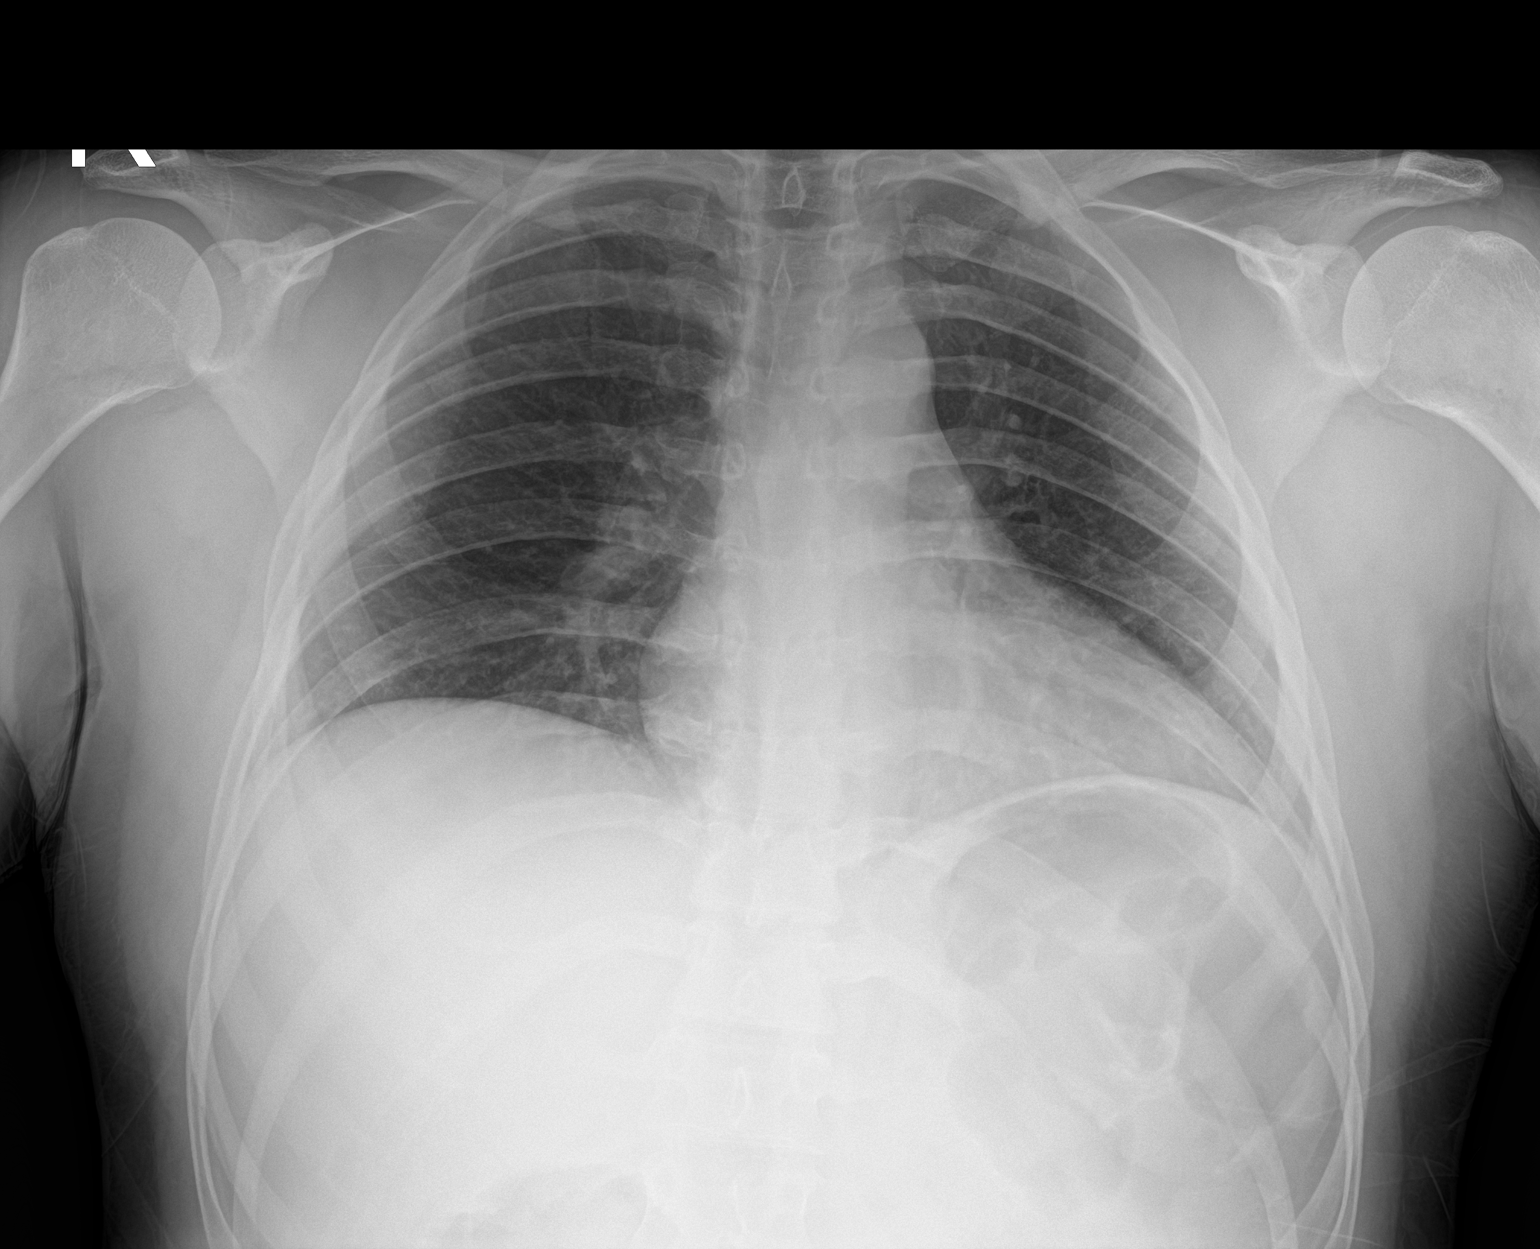

[1 of 1 positions shown; findings below may reference images not displayed]

FINDINGS: The heart size and mediastinal contours are within normal limits.
Both lungs are clear. The visualized skeletal structures are
unremarkable.
IMPRESSION: No active disease.

## 2022-01-20 ENCOUNTER — Encounter: Payer: Self-pay | Admitting: Orthopaedic Surgery

## 2022-01-20 ENCOUNTER — Ambulatory Visit: Payer: Managed Care, Other (non HMO) | Admitting: Orthopaedic Surgery

## 2022-01-20 ENCOUNTER — Ambulatory Visit (INDEPENDENT_AMBULATORY_CARE_PROVIDER_SITE_OTHER): Payer: Managed Care, Other (non HMO)

## 2022-01-20 DIAGNOSIS — G8929 Other chronic pain: Secondary | ICD-10-CM | POA: Diagnosis not present

## 2022-01-20 DIAGNOSIS — M25512 Pain in left shoulder: Secondary | ICD-10-CM

## 2022-01-20 NOTE — Progress Notes (Signed)
   Office Visit Note   Patient: Ronald Vang           Date of Birth: Feb 22, 1996           MRN: [id number] Visit Date: 01/20/2022              Requested by: No referring provider defined for this encounter. PCP: Patient, No Pcp Per   Assessment & Plan: Visit Diagnoses:  1. Chronic left shoulder pain     Plan: Impression is left shoulder [place] joint pain.  At this point, we have discussed referral to Dr. Clementeen Graham for ultrasound and possible [place] joint injection.  He would like to proceed.  Call with concerns or questions.  Follow-Up Instructions: Return if symptoms worsen or fail to improve.   Orders:  Orders Placed This Encounter  Procedures   XR Shoulder Left   No orders of the defined types were placed in this encounter.     Procedures: No procedures performed   Clinical Data: No additional findings.   Subjective: Chief Complaint  Patient presents with   Left Shoulder - Pain    HPI patient is a very pleasant 26 year old who comes in today with left clavicle pain.  This began a few months ago but worsened yesterday.  The pain he has is primarily to the left sternoclavicular joint.  Symptoms typically do not occur at rest but occur most frequently with activities such as push-ups and rowing which caused significant increase in pain yesterday.  He denies any weakness or paresthesias left upper extremity.  He does note a history of very similar pain to the right side 4 years ago.  No previous cortisone injection to the left shoulder.  Review of Systems as detailed in HPI.  All others reviewed and are negative.   Objective: Vital Signs: There were no vitals taken for this visit.  Physical Exam well-developed and well-nourished gentleman in no acute distress.  Alert and oriented x3.  Ortho Exam left shoulder exam: Moderate tenderness to the left [place] joint.  Full active and painless range of motion to the left shoulder.  Full strength throughout.  Negative empty can.   Negative cross body adduction.  He is neurovascular intact distally.  Specialty Comments:  No specialty comments available.  Imaging: XR Shoulder Left  Result Date: 01/20/2022 No acute or structural abnormalities    PMFS History: Patient Active Problem List   Diagnosis Date Noted   Seasonal and perennial allergic rhinoconjunctivitis 10/17/2018   Routine general medical examination at a health care facility 01/17/2014   Other general medical examination for administrative purposes 01/17/2014   Past Medical History:  Diagnosis Date   Asthma    COVID toes     Family History  Problem Relation Age of Onset   Allergic rhinitis Mother     History reviewed. No pertinent surgical history. Social History   Occupational History   Not on file  Tobacco Use   Smoking status: Never   Smokeless tobacco: Never  Vaping Use   Vaping Use: Never used  Substance and Sexual Activity   Alcohol use: Yes    Comment: social   Drug use: No   Sexual activity: Yes

## 2022-01-22 ENCOUNTER — Ambulatory Visit: Payer: Self-pay

## 2022-01-22 ENCOUNTER — Ambulatory Visit: Payer: Managed Care, Other (non HMO) | Admitting: Family Medicine

## 2022-01-22 VITALS — BP 98/70 | HR 58 | Ht 63.0 in | Wt 178.6 lb

## 2022-01-22 DIAGNOSIS — M25512 Pain in left shoulder: Secondary | ICD-10-CM

## 2022-01-22 NOTE — Progress Notes (Signed)
    Subjective:    CC: L clavicular/SCJ pain  I, Molly Weber, LAT, ATC, am serving as scribe for Dr. Clementeen Graham.  HPI: Pt is a 26 y/o male c/o L clavicular pain x few months that recently worsened.  He locates his pain to mid-clavicle. Pt note pain started on the R side and now is just on the L.  He was seen by Dr. Roda Shutters on 01/21/22 who referred him to see Dr. Denyse Amass for a possible injection.  Pt notes pain will just random occur, and it's intense, and then go away in 40 sec.   Radiates: no UE numbness/tingling: no Swelling: no Aggravating factors: can't determine Treatments tried: stop whatever he's doing and let pain resolve  Diagnostic testing: L shoulder XR- 01/20/22  Pertinent review of Systems: No fevers or chills  Relevant historical information: Patient works as a IT sales professional.   Objective:    Vitals:   01/22/22 0912  BP: 98/70  Pulse: (!) 58  SpO2: 98%   General: Well Developed, well nourished, and in no acute distress.   MSK: Left chest: Tender palpation at [facility] joint mildly.  Normal shoulder motion.  Intact strength.  Lab and Radiology Results No results found for this or any previous visit (from the past 72 hour(s)). XR Shoulder Left  Result Date: 01/20/2022 No acute or structural abnormalities   Diagnostic Limited MSK Ultrasound of: Left [place] joint Mild joint effusion present at [facility] joint. Bony structures otherwise normal-appearing Impression: Mild AC joint effusion   Impression and Recommendations:    Assessment and Plan: 26 y.o. male with left chest wall pain at [facility] joint thought to be [place] joint pain and effusion.  His job as a IT sales professional I think is a fundamental cause.  I think he is excessively loading his shoulder girdle and compressing and irritating the [place] joint.  We talked about injection as a diagnostic and therapeutic option.  He would like to try other options before proceeding with injection which is reasonable.  Plan for trial of physical therapy  and check back in 6 weeks.  If not improved consider injection or MRI first. Additionally he will look at what he is doing occupationally and seeing if he can change some of his work behaviors and load his shoulder girdle a little less. Marland Kitchen  PDMP not reviewed this encounter. Orders Placed This Encounter  Procedures   [place] LIMITED JOINT SPACE STRUCTURES UP LEFT(NO LINKED CHARGES)    Order Specific Question:   Reason for Exam (SYMPTOM  OR DIAGNOSIS REQUIRED)    Answer:   left shoulder pain    Order Specific Question:   Preferred imaging location?    Answer:   [facility] referral to Physical Therapy    Referral Priority:   Routine    Referral Type:   Physical Medicine    Referral Reason:   Specialty Services Required    Requested Specialty:   Physical Therapy    Number of Visits Requested:   1   No orders of the defined types were placed in this encounter.   Discussed warning signs or symptoms. Please see discharge instructions. Patient expresses understanding.   The above documentation has been reviewed and is accurate and complete Clementeen Graham, M.D.

## 2022-01-22 NOTE — Patient Instructions (Addendum)
Thank you for coming in today.   Please use Voltaren gel (Generic Diclofenac Gel) up to 4x daily for pain as needed.  This is available over-the-counter as both the name brand Voltaren gel and the generic diclofenac gel.   I've referred you to Physical Therapy.  Let us know if you don't hear from them in one week.   Check back in 6 weeks

## 2022-02-05 ENCOUNTER — Ambulatory Visit: Payer: Managed Care, Other (non HMO) | Attending: Family Medicine

## 2022-02-05 DIAGNOSIS — M25512 Pain in left shoulder: Secondary | ICD-10-CM | POA: Diagnosis present

## 2022-02-05 DIAGNOSIS — M6281 Muscle weakness (generalized): Secondary | ICD-10-CM | POA: Insufficient documentation

## 2022-02-05 DIAGNOSIS — R293 Abnormal posture: Secondary | ICD-10-CM | POA: Insufficient documentation

## 2022-02-05 NOTE — Therapy (Signed)
OUTPATIENT PHYSICAL THERAPY SHOULDER EVALUATION   Patient Name: Ronald Vang MRN: [id number] DOB:August 15, 1995, 26 y.o., male Today's Date: 02/05/2022    Past Medical History:  Diagnosis Date   Asthma    COVID toes    No past surgical history on file. Patient Active Problem List   Diagnosis Date Noted   Pain of left sternoclavicular joint 01/22/2022   Seasonal and perennial allergic rhinoconjunctivitis 10/17/2018   Routine general medical examination at a health care facility 01/17/2014   Other general medical examination for administrative purposes 01/17/2014    PCP: N/A  REFERRING PROVIDER: Earma Reading   REFERRING DIAG: M25.512  THERAPY DIAG:  No diagnosis found.  Rationale for Evaluation and Treatment Rehabilitation  ONSET DATE: 01/22/22  SUBJECTIVE:                                                                                                                                                                                      SUBJECTIVE STATEMENT: I have been having this pain for years, recently it has gotten worse. I was working out and I really felt the pain get bad.  Does a lot of overhead stuff at work depending on what needs to be done.    What movements make it worse?  Check posture   Palpation w/movement  Educate about possibly loading the front too much and having some weakness in posterior muscle and RC  Educate about repetitive movements and resting and icing   PERTINENT HISTORY: No remarkable history  PAIN:  Are you having pain? Yes: NPRS scale: 9/10 Pain location: L SCJ Pain description: sharp pain Aggravating factors: random there's no real pattern Relieving factors: not doing anything  PRECAUTIONS: None  WEIGHT BEARING RESTRICTIONS No  FALLS:  Has patient fallen in last 6 months? No  LIVING ENVIRONMENT: Lives with: lives with their family Lives in: House/apartment Stairs: No Has following equipment at home:  None  OCCUPATION: Firefighter  PLOF: Independent  PATIENT GOALS To get rid of the pain   OBJECTIVE:   DIAGNOSTIC FINDINGS:  Mild joint effusion present at [facility] joint. Bony structures otherwise normal-appearing Impression: Mild AC joint effusion  COGNITION:  Overall cognitive status: Within functional limits for tasks assessed     SENSATION: WFL  POSTURE: Rounded shoulders  UPPER EXTREMITY ROM: WFL   UPPER EXTREMITY MMT:  MMT Right eval Left eval  Shoulder flexion 4+ 4+  Shoulder extension    Shoulder abduction 4+ 4+  Shoulder adduction    Shoulder internal rotation 4 4  Shoulder external rotation 4 4  Middle trapezius 3+ 3+  Lower trapezius 3+ 3+  Elbow flexion 5 5  Elbow extension 5  5  Wrist flexion    Wrist extension    Wrist ulnar deviation    Wrist radial deviation    Wrist pronation    Wrist supination    Grip strength (lbs)    (Blank rows = not tested)   JOINT MOBILITY TESTING:  Decreased mobility in SCJ   PALPATION:  Not TTP   TODAY'S TREATMENT:  POC and stretching   PATIENT EDUCATION: Education details: POC Person educated: Patient Education method: Explanation Education comprehension: verbalized understanding   HOME EXERCISE PROGRAM: Doorway pec stretch 30s Pec and bicep stretch behind back 30s  ASSESSMENT:  CLINICAL IMPRESSION: Patient is a 26 y.o. male who was seen today for physical therapy evaluation and treatment for L sternoclavicular pain. He works as a IT sales professional and has been dealing with this pain for a few years now. It has recently intensified to the point where he feels like he can not be at work anymore. His pain is temporary and does not last long, but when it does come on it is bad. Patient has increased tightness in his pecs and has some weakness in rotator cuff muscles bilaterally and mid back. He was educated to take a break from doing overhead activities and heavy loading until the fluid and inflammation in the  joint resolves. Tried an ionto patch to see if it will help with his pain. He will benefit from skilled PT intervention to address muscle imbalance and his SCJ pain to be able to work and participate in the gym without difficulty.     OBJECTIVE IMPAIRMENTS increased fascial restrictions and pain.   PARTICIPATION LIMITATIONS: occupation   REHAB POTENTIAL: Fair    CLINICAL DECISION MAKING: Stable/uncomplicated  EVALUATION COMPLEXITY: Moderate  GOALS: Goals reviewed with patient? Yes  SHORT TERM GOALS: Target date: 03/12/22  Patient will be independent with initial HEP.  Goal status: INITIAL    LONG TERM GOALS: Target date: 04/16/22  Patient will be independent with advanced/ongoing HEP to improve outcomes and carryover.  Goal status: INITIAL  2.  Patient will report 75% improvement in L SCJ to improve QOL.  Goal status: INITIAL  3.  Patient to demonstrate improved upright posture with posterior shoulder girdle engaged to promote improved glenohumeral joint mobility. Baseline: rounded shoulders, protracted scapulas Goal status: INITIAL  4.  Patient to improve L SCJ pain at worst to < 4/10 or better to allow for increased ease of ADLs.  Baseline: 9/10 Goal status: INITIAL  5.  Patient will demonstrate improved functional UE strength as demonstrated by 5/5 in all weak muscle groups. Goal status: INITIAL    PLAN: PT FREQUENCY: 1x/week  PT DURATION: 10 weeks  PLANNED INTERVENTIONS: Therapeutic exercises, Therapeutic activity, Neuromuscular re-education, Balance training, Gait training, Patient/Family education, Self Care, Joint mobilization, Joint manipulation, Dry Needling, Cryotherapy, Moist heat, Splintting, Taping, Ultrasound, and Ionotophoresis 4mg /ml Dexamethasone  PLAN FOR NEXT SESSION: assess pain and ionto patch, stretching pecs, mid back strengthening.   [place], PT 02/05/2022, 11:02 AM

## 2022-02-13 ENCOUNTER — Ambulatory Visit: Payer: Managed Care, Other (non HMO) | Admitting: Physical Therapy

## 2022-02-13 DIAGNOSIS — M6281 Muscle weakness (generalized): Secondary | ICD-10-CM

## 2022-02-13 DIAGNOSIS — M25512 Pain in left shoulder: Secondary | ICD-10-CM

## 2022-02-13 DIAGNOSIS — R293 Abnormal posture: Secondary | ICD-10-CM

## 2022-02-13 NOTE — Therapy (Signed)
OUTPATIENT PHYSICAL THERAPY SHOULDER   Patient Name: Ronald Vang, 26 y.o., Ronald Vang Today's Date: 02/13/2022   PT End of Session - 02/13/22 0838     Visit Number 2    Date for PT Re-Evaluation 04/16/22    Authorization Type [employer]    PT Start Time [phone]    PT Stop Time 0925    PT Time Calculation (min) 47 min             Past Medical History:  Diagnosis Date   Asthma    COVID toes    No past surgical history on file. Patient Active Problem List   Diagnosis Date Noted   Pain of left sternoclavicular joint 01/22/2022   Seasonal and perennial allergic rhinoconjunctivitis 10/17/2018   Routine general medical examination at a health care facility 01/17/2014   Other general medical examination for administrative purposes 01/17/2014    PCP: N/A  REFERRING PROVIDER: Sherene Sires   REFERRING DIAG: M25.512  THERAPY DIAG:  Pain of left sternoclavicular joint  Muscle weakness (generalized)  Abnormal posture  Rationale for Evaluation and Treatment Rehabilitation  ONSET DATE: 01/22/22  SUBJECTIVE:                                                                                                                                                                                      SUBJECTIVE STATEMENT:doing good. Stretching helping and I think patch helped  PERTINENT HISTORY: No remarkable history  PAIN:  Are you having pain? NO  PRECAUTIONS: None  WEIGHT BEARING RESTRICTIONS No  FALLS:  Has patient fallen in last 6 months? No  LIVING ENVIRONMENT: Lives with: lives with their family Lives in: House/apartment Stairs: No Has following equipment at home: None  OCCUPATION: Firefighter  PLOF: Independent  PATIENT GOALS To get rid of the pain   OBJECTIVE:   DIAGNOSTIC FINDINGS:  Mild joint effusion present at [facility] joint. Bony structures otherwise normal-appearing Impression: Mild AC joint effusion  COGNITION:  Overall cognitive  status: Within functional limits for tasks assessed     SENSATION: WFL  POSTURE: Rounded shoulders  UPPER EXTREMITY ROM: WFL   UPPER EXTREMITY MMT:  MMT Right eval Left eval  Shoulder flexion 4+ 4+  Shoulder extension    Shoulder abduction 4+ 4+  Shoulder adduction    Shoulder internal rotation 4 4  Shoulder external rotation 4 4  Middle trapezius 3+ 3+  Lower trapezius 3+ 3+  Elbow flexion 5 5  Elbow extension 5 5  Wrist flexion    Wrist extension    Wrist ulnar deviation    Wrist radial deviation    Wrist pronation  Wrist supination    Grip strength (lbs)    (Blank rows = not tested)   JOINT MOBILITY TESTING:  Decreased mobility in SCJ   PALPATION:  Not TTP   TODAY'S TREATMENT:    02/13/22 UBE L 4 3 min fwd/3 min back Performed and issued HEP Chest press with serratus 45# 15 x Ball vs wall CW and CCW 5 x each Green tband 3 pt stab on wall 15 each Ionto #2    Eval POC and stretching   PATIENT EDUCATION: Education details: [id number] scap stab blue tband 6# Person educated: Patient Education method: Explanation Education comprehension: verbalized understanding   HOME EXERCISE PROGRAM: Doorway pec stretch 30s Pec and bicep stretch behind back 30s  ASSESSMENT:  CLINICAL IMPRESSION: tolerated initial progression of ex well, minimal pain that stopped with rest and some fatigue. Postural cuing needed. Progressed strengthening HEP   OBJECTIVE IMPAIRMENTS increased fascial restrictions and pain.   PARTICIPATION LIMITATIONS: occupation   REHAB POTENTIAL: Fair    CLINICAL DECISION MAKING: Stable/uncomplicated  EVALUATION COMPLEXITY: Moderate  GOALS: Goals reviewed with patient? Yes  SHORT TERM GOALS: Target date: 03/12/22  Patient will be independent with initial HEP.  Goal status: met 8/18./23    LONG TERM GOALS: Target date: 04/16/22  Patient will be independent with advanced/ongoing HEP to improve outcomes and carryover.  Goal  status: INITIAL  2.  Patient will report 75% improvement in L SCJ to improve QOL.  Goal status: INITIAL  3.  Patient to demonstrate improved upright posture with posterior shoulder girdle engaged to promote improved glenohumeral joint mobility. Baseline: rounded shoulders, protracted scapulas Goal status: INITIAL  4.  Patient to improve L SCJ pain at worst to < 4/10 or better to allow for increased ease of ADLs.  Baseline: 9/10 Goal status: INITIAL  5.  Patient will demonstrate improved functional UE strength as demonstrated by 5/5 in all weak muscle groups. Goal status: INITIAL    PLAN: PT FREQUENCY: 1x/week  PT DURATION: 10 weeks  PLANNED INTERVENTIONS: Therapeutic exercises, Therapeutic activity, Neuromuscular re-education, Balance training, Gait training, Patient/Family education, Self Care, Joint mobilization, Joint manipulation, Dry Needling, Cryotherapy, Moist heat, Splintting, Taping, Ultrasound, and Ionotophoresis 80m/ml Dexamethasone  PLAN FOR NEXT SESSION: assess pain and ionto patch, stretching pecs, mid back strengthening.   Katlin Ronald Vang,ANGIE, PTA 02/13/2022, 8:38 AM CKent Ronald Vang  Fax:  [phone] Patient Details  Name: Ronald ChoyceMRN: [id number] of Birth: [birth year]Referring Provider:  No ref. provider found  Encounter Date: 02/13/2022   Ronald Penna8/18/2023, 8:38 AM  [place] [address]Phone: [phone]  Fax:  [phone]

## 2022-02-18 ENCOUNTER — Encounter: Payer: Self-pay | Admitting: Physical Therapy

## 2022-02-18 ENCOUNTER — Ambulatory Visit: Payer: Managed Care, Other (non HMO) | Admitting: Physical Therapy

## 2022-02-18 DIAGNOSIS — M25512 Pain in left shoulder: Secondary | ICD-10-CM | POA: Diagnosis not present

## 2022-02-18 DIAGNOSIS — M6281 Muscle weakness (generalized): Secondary | ICD-10-CM

## 2022-02-18 DIAGNOSIS — R293 Abnormal posture: Secondary | ICD-10-CM

## 2022-02-18 NOTE — Therapy (Signed)
OUTPATIENT PHYSICAL THERAPY SHOULDER   Patient Name: Ronald Vang MRN: [id number] DOB:1996/02/02, 26 y.o., male Today's Date: 02/18/2022   PT End of Session - 02/18/22 1602     Visit Number 3    Date for PT Re-Evaluation 04/16/22    PT Start Time 1600    PT Stop Time 1645    PT Time Calculation (min) 45 min    Activity Tolerance Patient tolerated treatment well    Behavior During Therapy Ronald Vang for tasks assessed/performed             Past Medical History:  Diagnosis Date   Asthma    COVID toes    History reviewed. No pertinent surgical history. Patient Active Problem List   Diagnosis Date Noted   Pain of left sternoclavicular joint 01/22/2022   Seasonal and perennial allergic rhinoconjunctivitis 10/17/2018   Routine general medical examination at a health care facility 01/17/2014   Other general medical examination for administrative purposes 01/17/2014    PCP: N/A  REFERRING PROVIDER: Sherene Vang   REFERRING DIAG: [phone]  THERAPY DIAG:  Pain of left sternoclavicular joint  Muscle weakness (generalized)  Abnormal posture  Rationale for Evaluation and Treatment Rehabilitation  ONSET DATE: 01/22/22  SUBJECTIVE:                                                                                                                                                                                      SUBJECTIVE STATEMENT:  "Im good" It comes and goes, has not bothered him all week  PERTINENT HISTORY: No remarkable history  PAIN:  Are you having pain? NO  PRECAUTIONS: None  WEIGHT BEARING RESTRICTIONS No  FALLS:  Has patient fallen in last 6 months? No  LIVING ENVIRONMENT: Lives with: lives with their family Lives in: House/apartment Stairs: No Has following equipment at home: None  OCCUPATION: Firefighter  PLOF: Independent  PATIENT GOALS To get rid of the pain   OBJECTIVE:   DIAGNOSTIC FINDINGS:  Mild joint effusion present at [facility] joint. Bony  structures otherwise normal-appearing Impression: Mild AC joint effusion  COGNITION:  Overall cognitive status: Within functional limits for tasks assessed     SENSATION: WFL  POSTURE: Rounded shoulders  UPPER EXTREMITY ROM: WFL   UPPER EXTREMITY MMT:  MMT Right eval Left eval  Shoulder flexion 4+ 4+  Shoulder extension    Shoulder abduction 4+ 4+  Shoulder adduction    Shoulder internal rotation 4 4  Shoulder external rotation 4 4  Middle trapezius 3+ 3+  Lower trapezius 3+ 3+  Elbow flexion 5 5  Elbow extension 5 5  Wrist flexion    Wrist extension  Wrist ulnar deviation    Wrist radial deviation    Wrist pronation    Wrist supination    Grip strength (lbs)    (Blank rows = not tested)   JOINT MOBILITY TESTING:  Decreased mobility in SCJ   PALPATION:  Not TTP   TODAY'S TREATMENT:   02/18/22 UBE L3 x 3 min each Shoulder ER green 2x10 LUE ER abducted to 90 2x10 Chest press with serratus 45# 2x10 Rows 35lb & Lats 45lb 2x10 Wall push ups 2x10 Pec stretch 3x15 sec  Horiz abd green 2x10 Ionto patch 3   02/13/22 UBE L 4 3 min fwd/3 min back Performed and issued HEP Chest press with serratus 45# 15 x Ball vs wall CW and CCW 5 x each Green tband 3 pt stab on wall 15 each Ionto #2    Eval POC and stretching   PATIENT EDUCATION: Education details: [id number] scap stab blue tband 6# Person educated: Patient Education method: Explanation Education comprehension: verbalized understanding   HOME EXERCISE PROGRAM: Doorway pec stretch 30s Pec and bicep stretch behind back 30s  ASSESSMENT:  CLINICAL IMPRESSION:  Pt enters feeling well reporting no pain all week. Session consisted of postural strengthening and stretching. No issues completing exercise interventions. LUE fatigue noted with ER abducted to 90.    OBJECTIVE IMPAIRMENTS increased fascial restrictions and pain.   PARTICIPATION LIMITATIONS: occupation   REHAB POTENTIAL: Fair     CLINICAL DECISION MAKING: Stable/uncomplicated  EVALUATION COMPLEXITY: Moderate  GOALS: Goals reviewed with patient? Yes  SHORT TERM GOALS: Target date: 03/12/22  Patient will be independent with initial HEP.  Goal status: met 8/18./23    LONG TERM GOALS: Target date: 04/16/22  Patient will be independent with advanced/ongoing HEP to improve outcomes and carryover.  Goal status: INITIAL  2.  Patient will report 75% improvement in L SCJ to improve QOL.  Goal status: INITIAL  3.  Patient to demonstrate improved upright posture with posterior shoulder girdle engaged to promote improved glenohumeral joint mobility. Baseline: rounded shoulders, protracted scapulas Goal status: INITIAL  4.  Patient to improve L SCJ pain at worst to < 4/10 or better to allow for increased ease of ADLs.  Baseline: 9/10 Goal status: INITIAL  5.  Patient will demonstrate improved functional UE strength as demonstrated by 5/5 in all weak muscle groups. Goal status: INITIAL    PLAN: PT FREQUENCY: 1x/week  PT DURATION: 10 weeks  PLANNED INTERVENTIONS: Therapeutic exercises, Therapeutic activity, Neuromuscular re-education, Balance training, Gait training, Patient/Family education, Self Care, Joint mobilization, Joint manipulation, Dry Needling, Cryotherapy, Moist heat, Splintting, Taping, Ultrasound, and Ionotophoresis 41m/ml Dexamethasone  PLAN FOR NEXT SESSION: assess pain and ionto patch, stretching pecs, mid back strengthening.   Ronald Vang 02/18/2022, 4:03 PM [place] [address]Phone: [phone]  Fax:  [phone] Patient Details  Name: GSenai KingsleyMRN: [id number] of Birth: 406/11/1997Referring Provider:  No ref. provider found  Encounter Date: 02/18/2022   Ronald Vang 02/18/2022, 4:03 PM  [place] [id number]: [phone]  Fax:  [phone]

## 2022-02-25 ENCOUNTER — Ambulatory Visit: Payer: Managed Care, Other (non HMO) | Admitting: Physical Therapy

## 2022-03-03 ENCOUNTER — Encounter: Payer: Self-pay | Admitting: Physical Therapy

## 2022-03-03 ENCOUNTER — Ambulatory Visit: Payer: Managed Care, Other (non HMO) | Attending: Family Medicine | Admitting: Physical Therapy

## 2022-03-03 DIAGNOSIS — M25512 Pain in left shoulder: Secondary | ICD-10-CM | POA: Diagnosis present

## 2022-03-03 DIAGNOSIS — M6281 Muscle weakness (generalized): Secondary | ICD-10-CM | POA: Diagnosis present

## 2022-03-03 DIAGNOSIS — R293 Abnormal posture: Secondary | ICD-10-CM | POA: Diagnosis present

## 2022-03-03 NOTE — Therapy (Signed)
OUTPATIENT PHYSICAL THERAPY SHOULDER   Patient Name: Ronald Vang MRN: [id number] DOB:1995/09/18, 26 y.o., male Today's Date: 03/03/2022   PT End of Session - 03/03/22 1018     Visit Number 4    Date for PT Re-Evaluation 04/16/22    PT Start Time 9323    PT Stop Time 1100    PT Time Calculation (min) 45 min    Activity Tolerance Patient tolerated treatment well    Behavior During Therapy [facility] for tasks assessed/performed             Past Medical History:  Diagnosis Date   Asthma    COVID toes    History reviewed. No pertinent surgical history. Patient Active Problem List   Diagnosis Date Noted   Pain of left sternoclavicular joint 01/22/2022   Seasonal and perennial allergic rhinoconjunctivitis 10/17/2018   Routine general medical examination at a health care facility 01/17/2014   Other general medical examination for administrative purposes 01/17/2014    PCP: N/A  REFERRING PROVIDER: Sherene Sires   REFERRING DIAG: [phone]  THERAPY DIAG:  Pain of left sternoclavicular joint  Muscle weakness (generalized)  Abnormal posture  Rationale for Evaluation and Treatment Rehabilitation  ONSET DATE: 01/22/22  SUBJECTIVE:                                                                                                                                                                                      SUBJECTIVE STATEMENT: "I feel pretty good"  PERTINENT HISTORY: No remarkable history  PAIN:  Are you having pain? NO  PRECAUTIONS: None  WEIGHT BEARING RESTRICTIONS No  FALLS:  Has patient fallen in last 6 months? No  LIVING ENVIRONMENT: Lives with: lives with their family Lives in: House/apartment Stairs: No Has following equipment at home: None  OCCUPATION: Firefighter  PLOF: Independent  PATIENT GOALS To get rid of the pain   OBJECTIVE:   DIAGNOSTIC FINDINGS:  Mild joint effusion present at [facility] joint. Bony structures otherwise  normal-appearing Impression: Mild AC joint effusion  COGNITION:  Overall cognitive status: Within functional limits for tasks assessed     SENSATION: WFL  POSTURE: Rounded shoulders  UPPER EXTREMITY ROM: WFL   UPPER EXTREMITY MMT:  MMT Right eval Left eval  Shoulder flexion 4+ 4+  Shoulder extension    Shoulder abduction 4+ 4+  Shoulder adduction    Shoulder internal rotation 4 4  Shoulder external rotation 4 4  Middle trapezius 3+ 3+  Lower trapezius 3+ 3+  Elbow flexion 5 5  Elbow extension 5 5  Wrist flexion    Wrist extension    Wrist ulnar deviation    Wrist radial  deviation    Wrist pronation    Wrist supination    Grip strength (lbs)    (Blank rows = not tested)   JOINT MOBILITY TESTING:  Decreased mobility in SCJ   PALPATION:  Not TTP   TODAY'S TREATMENT:  03/03/22 UBE L3 x 3 min each Shoulder UE grossly 5/5 Seated rows 45lb 2x10 Lats 45lb 2x10 Shoulder ER green 2x10 Shoulder Ext 15lb 2x10 Chest press with serratus 45lb 2x10  Pec stretch 15'' x4 Wall push ups 2x15  Horiz abd green 2x10 Iontopatch 4  02/18/22 UBE L3 x 3 min each Shoulder ER green 2x10 LUE ER abducted to 90 2x10 Chest press with serratus 45# 2x10 Rows 35lb & Lats 45lb 2x10 Wall push ups 2x10 Pec stretch 3x15 sec  Horiz abd green 2x10 Ionto patch 3   02/13/22 UBE L 4 3 min fwd/3 min back Performed and issued HEP Chest press with serratus 45# 15 x Ball vs wall CW and CCW 5 x each Green tband 3 pt stab on wall 15 each Ionto #2    Eval POC and stretching   PATIENT EDUCATION: Education details: [id number] scap stab blue tband 6# Person educated: Patient Education method: Explanation Education comprehension: verbalized understanding   HOME EXERCISE PROGRAM: Doorway pec stretch 30s Pec and bicep stretch behind back 30s  ASSESSMENT:  CLINICAL IMPRESSION:  Pt enters feeling well reporting no pain. He has progressed meeting all goals. No issues completing today's  interventions. Pt reports no functional limitations at home  OBJECTIVE IMPAIRMENTS increased fascial restrictions and pain.   PARTICIPATION LIMITATIONS: occupation   REHAB POTENTIAL: Fair    CLINICAL DECISION MAKING: Stable/uncomplicated  EVALUATION COMPLEXITY: Moderate  GOALS: Goals reviewed with patient? Yes  SHORT TERM GOALS: Target date: 03/12/22  Patient will be independent with initial HEP.  Goal status: met 8/18./23    LONG TERM GOALS: Target date: 04/16/22  Patient will be independent with advanced/ongoing HEP to improve outcomes and carryover.  Goal status: met  2.  Patient will report 75% improvement in L SCJ to improve QOL.  Goal status: met   3.  Patient to demonstrate improved upright posture with posterior shoulder girdle engaged to promote improved glenohumeral joint mobility. Baseline: rounded shoulders, protracted scapulas Goal status: met  4.  Patient to improve L SCJ pain at worst to < 4/10 or better to allow for increased ease of ADLs.  Baseline: met Goal status: met  5.  Patient will demonstrate improved functional UE strength as demonstrated by 5/5 in all weak muscle groups. Goal status: met  PHYSICAL THERAPY DISCHARGE SUMMARY  Visits from Start of Care: 4 Patient agrees to discharge. Patient goals were met. Patient is being discharged due to meeting the stated rehab goals.   PLAN: PT FREQUENCY: 1x/week  PT DURATION: 10 weeks  PLANNED INTERVENTIONS: Therapeutic exercises, Therapeutic activity, Neuromuscular re-education, Balance training, Gait training, Patient/Family education, Self Care, Joint mobilization, Joint manipulation, Dry Needling, Cryotherapy, Moist heat, Splintting, Taping, Ultrasound, and Ionotophoresis 4mg /ml Dexamethasone  PLAN FOR NEXT SESSION: D/C PT  Scot Jun, PTA 03/03/2022, 10:18 AM [address] [place], [place], [address] Phone: [phone]    Fax:  [phone]  Patient Details  Name: Ronald Vang MRN: [id number] Date of Birth: 10/07/1995 Referring Provider:  Gregor Hams, MD  Encounter Date: 03/03/2022   Scot Jun, PTA 03/03/2022, 10:18 AM  [place] [place], [place],  [place] Phone: [phone]   Fax:  [phone]

## 2022-09-28 ENCOUNTER — Other Ambulatory Visit: Payer: Self-pay | Admitting: Allergy

## 2022-10-06 ENCOUNTER — Other Ambulatory Visit: Payer: Self-pay | Admitting: Allergy

## 2022-10-06 NOTE — Telephone Encounter (Signed)
Patient called into the office requesting a refill on his medications. Advised patient that patient's that are seen for allergies only need to be seen 1 time per year to keep getting refills. Patient has not been here in 2 years. Appointment has been made for 10/09/22 at 10AM with Thurston Hole. Patient has been notified of new address.

## 2022-10-08 ENCOUNTER — Ambulatory Visit: Payer: Managed Care, Other (non HMO) | Admitting: Family Medicine

## 2022-10-08 ENCOUNTER — Encounter: Payer: Self-pay | Admitting: Family Medicine

## 2022-10-08 ENCOUNTER — Other Ambulatory Visit: Payer: Self-pay

## 2022-10-08 VITALS — BP 120/78 | HR 57 | Temp 98.2°F | Resp 18 | Ht 63.39 in | Wt 185.0 lb

## 2022-10-08 DIAGNOSIS — J302 Other seasonal allergic rhinitis: Secondary | ICD-10-CM

## 2022-10-08 DIAGNOSIS — J3089 Other allergic rhinitis: Secondary | ICD-10-CM | POA: Insufficient documentation

## 2022-10-08 DIAGNOSIS — H1010 Acute atopic conjunctivitis, unspecified eye: Secondary | ICD-10-CM

## 2022-10-08 DIAGNOSIS — H1013 Acute atopic conjunctivitis, bilateral: Secondary | ICD-10-CM | POA: Diagnosis not present

## 2022-10-08 MED ORDER — MONTELUKAST SODIUM 10 MG PO TABS: ORAL_TABLET | ORAL | 3 refills | Status: DC

## 2022-10-08 MED ORDER — CETIRIZINE HCL 10 MG PO TABS: 10.0000 mg | ORAL_TABLET | Freq: Every day | ORAL | 3 refills | Status: DC

## 2022-10-08 NOTE — Patient Instructions (Signed)
Allergic rhinitis Continue allergen avoidance measures directed toward grass pollen, tree pollen, weed pollen, ragweed pollen, dust mite, and mold as listed below Continue montelukast 10 mg once a day to control allergy symptoms Continue cetirizine 10 mg once a day as needed for runny nose or itch continue montelukast 10 mg once a day for allergy symptoms Continue azelastine 2 sprays in each nostril once a day as needed for runny nose Continue Flonase 2 sprays in each nostril once a day as needed for stuffy nose Consider saline nasal rinses as needed for nasal symptoms. Use this before any medicated nasal sprays for best result Consider allergen immunotherapy if your symptoms are not well-controlled with the treatment plan as listed below  Allergic conjunctivitis Some over the counter eye drops include Pataday one drop in each eye once a day as needed for red, itchy eyes OR Zaditor one drop in each eye twice a day as needed for red itchy eyes. Avoid eye drops that say red eye relief as they may contain medications that dry out your eyes.   Call the clinic if this treatment plan is not working well for you.  Follow up in 1 year or sooner if needed.  Reducing Pollen Exposure The American Academy of Allergy, Asthma and Immunology suggests the following steps to reduce your exposure to pollen during allergy seasons. Do not hang sheets or clothing out to dry; pollen may collect on these items. Do not mow lawns or spend time around freshly cut grass; mowing stirs up pollen. Keep windows closed at night.  Keep car windows closed while driving. Minimize morning activities outdoors, a time when pollen counts are usually at their highest. Stay indoors as much as possible when pollen counts or humidity is high and on windy days when pollen tends to remain in the air longer. Use air conditioning when possible.  Many air conditioners have filters that trap the pollen spores. Use a HEPA room air filter to  remove pollen form the indoor air you breathe.   Control of Dust Mite Allergen Dust mites play a major role in allergic asthma and rhinitis. They occur in environments with high humidity wherever human skin is found. Dust mites absorb humidity from the atmosphere (ie, they do not drink) and feed on organic matter (including shed human and animal skin). Dust mites are a microscopic type of insect that you cannot see with the naked eye. High levels of dust mites have been detected from mattresses, pillows, carpets, upholstered furniture, bed covers, clothes, soft toys and any woven material. The principal allergen of the dust mite is found in its feces. A gram of dust may contain 1,000 mites and 250,000 fecal particles. Mite antigen is easily measured in the air during house cleaning activities. Dust mites do not bite and do not cause harm to humans, other than by triggering allergies/asthma.  Ways to decrease your exposure to dust mites in your home:  1. Encase mattresses, box springs and pillows with a mite-impermeable barrier or cover  2. Wash sheets, blankets and drapes weekly in hot water (130 F) with detergent and dry them in a dryer on the hot setting.  3. Have the room cleaned frequently with a vacuum cleaner and a damp dust-mop. For carpeting or rugs, vacuuming with a vacuum cleaner equipped with a high-efficiency particulate air (HEPA) filter. The dust mite allergic individual should not be in a room which is being cleaned and should wait 1 hour after cleaning before going into the  room.  4. Do not sleep on upholstered furniture (eg, couches).  5. If possible removing carpeting, upholstered furniture and drapery from the home is ideal. Horizontal blinds should be eliminated in the rooms where the person spends the most time (bedroom, study, television room). Washable vinyl, roller-type shades are optimal.  6. Remove all non-washable stuffed toys from the bedroom. Wash stuffed toys weekly  like sheets and blankets above.  7. Reduce indoor humidity to less than 50%. Inexpensive humidity monitors can be purchased at most hardware stores. Do not use a humidifier as can make the problem worse and are not recommended.  Control of Mold Allergen Mold and fungi can grow on a variety of surfaces provided certain temperature and moisture conditions exist.  Outdoor molds grow on plants, decaying vegetation and soil.  The major outdoor mold, Alternaria and Cladosporium, are found in very high numbers during hot and dry conditions.  Generally, a late Summer - Fall peak is seen for common outdoor fungal spores.  Rain will temporarily lower outdoor mold spore count, but counts rise rapidly when the rainy period ends.  The most important indoor molds are Aspergillus and Penicillium.  Dark, humid and poorly ventilated basements are ideal sites for mold growth.  The next most common sites of mold growth are the bathroom and the kitchen.  Outdoor [employer] Use air conditioning and keep windows closed Avoid exposure to decaying vegetation. Avoid leaf raking. Avoid grain handling. Consider wearing a face mask if working in moldy areas.  Indoor Mold Control Maintain humidity below 50%. Clean washable surfaces with 5% bleach solution. Remove sources e.g. Contaminated carpets.

## 2022-10-08 NOTE — Progress Notes (Signed)
[address] [place] [address] Dept: [phone]  FOLLOW UP NOTE  Patient ID: Ronald Vang, male    DOB: Jan 21, 1996  Age: 27 y.o. MRN: [id number] Date of Office Visit: 10/08/2022  Assessment  Chief Complaint: Allergic Rhinitis  (Med refills )  HPI Ronald Vang is a 27 year old male who presents to the clinic for follow-up visit.  He was last seen in this clinic on 09/25/2020 by Dr. Selena Batten for evaluation of allergic rhinitis and allergic conjunctivitis.    At today's visit, he reports his allergies have been moderately well-controlled with symptoms including rhinorrhea, and nasal congestion, and postnasal drainage.  He continues montelukast 10 mg once a day, cetirizine daily, and Flonase as needed.  He is not currently using a nasal saline rinse.  He reports that when he takes his medicines his allergy symptoms are very well-controlled.  His last environmental allergy skin testing was on 08/18/2018 and was positive to grass pollen, tree pollen, weed pollen, ragweed pollen, dust mite, and mold.  He reports that he does not have dust mite covers in use at this time.  He does have 2 dogs in his home.  Allergic conjunctivitis is reported as moderately well-controlled with occasional red and itchy eyes for which she is not currently using any medical intervention.  His current medications are listed in the chart.  Drug Allergies:  No Known Allergies  Physical Exam: BP 120/78   Pulse (!) 57   Temp 98.2 F (36.8 C)   Resp 18   Ht 5' 3.39" (1.61 m)   Wt 185 lb (83.9 kg)   SpO2 95%   BMI 32.37 kg/m    Physical Exam Vitals reviewed.  Constitutional:      Appearance: Normal appearance.  HENT:     Head: Normocephalic and atraumatic.     Right Ear: Tympanic membrane normal.     Left Ear: Tympanic membrane normal.     Nose:     Comments: Bilateral nares edematous and pale with clear nasal drainage noted.  Pharynx normal.  Ears normal.  Eyes normal.    Mouth/Throat:     Pharynx:  Oropharynx is clear.  Eyes:     Conjunctiva/sclera: Conjunctivae normal.  Cardiovascular:     Rate and Rhythm: Normal rate and regular rhythm.     Heart sounds: Normal heart sounds. No murmur heard. Pulmonary:     Effort: Pulmonary effort is normal.     Breath sounds: Normal breath sounds.     Comments: Lungs clear to auscultation Musculoskeletal:        General: Normal range of motion.     Cervical back: Normal range of motion and neck supple.  Skin:    General: Skin is warm and dry.  Neurological:     Mental Status: He is alert and oriented to person, place, and time.  Psychiatric:        Mood and Affect: Mood normal.        Behavior: Behavior normal.        Thought Content: Thought content normal.        Judgment: Judgment normal.     Assessment and Plan: 1. Seasonal and perennial allergic rhinitis   2. Seasonal allergic conjunctivitis     Meds ordered this encounter  Medications   montelukast (SINGULAIR) 10 MG tablet    Sig: TAKE 1 TABLET BY MOUTH EVERYDAY AT BEDTIME    Dispense:  90 tablet    Refill:  3   cetirizine (ZYRTEC) 10  MG tablet    Sig: Take 1 tablet (10 mg total) by mouth daily.    Dispense:  90 tablet    Refill:  3    Patient Instructions  Allergic rhinitis Continue allergen avoidance measures directed toward grass pollen, tree pollen, weed pollen, ragweed pollen, dust mite, and mold as listed below Continue montelukast 10 mg once a day to control allergy symptoms Continue cetirizine 10 mg once a day as needed for runny nose or itch continue montelukast 10 mg once a day for allergy symptoms Continue azelastine 2 sprays in each nostril once a day as needed for runny nose Continue Flonase 2 sprays in each nostril once a day as needed for stuffy nose Consider saline nasal rinses as needed for nasal symptoms. Use this before any medicated nasal sprays for best result Consider allergen immunotherapy if your symptoms are not well-controlled with the  treatment plan as listed below  Allergic conjunctivitis Some over the counter eye drops include Pataday one drop in each eye once a day as needed for red, itchy eyes OR Zaditor one drop in each eye twice a day as needed for red itchy eyes. Avoid eye drops that say red eye relief as they may contain medications that dry out your eyes.   Call the clinic if this treatment plan is not working well for you.  Follow up in 1 year or sooner if needed.   Return in about 1 year (around 10/08/2023), or if symptoms worsen or fail to improve.    Thank you for the opportunity to care for this patient.  Please do not hesitate to contact me with questions.  Thermon Leyland, FNP [facility]

## 2022-10-09 ENCOUNTER — Ambulatory Visit: Payer: Managed Care, Other (non HMO) | Admitting: Family Medicine

## 2023-08-27 ENCOUNTER — Ambulatory Visit
Admission: EM | Admit: 2023-08-27 | Discharge: 2023-08-27 | Disposition: A | Discharge status: Home / Self Care | Payer: Managed Care, Other (non HMO)

## 2023-08-27 DIAGNOSIS — J101 Influenza due to other identified influenza virus with other respiratory manifestations: Secondary | ICD-10-CM

## 2023-08-27 LAB — POC COVID19/FLU A&B COMBO
Covid Antigen, POC: NEGATIVE
Influenza A Antigen, POC: POSITIVE — AB
Influenza B Antigen, POC: NEGATIVE

## 2023-08-27 MED ORDER — BENZONATATE 100 MG PO CAPS: 200.0000 mg | ORAL_CAPSULE | Freq: Three times a day (TID) | ORAL | 0 refills | Status: DC | PRN

## 2023-08-27 MED ORDER — OSELTAMIVIR PHOSPHATE 75 MG PO CAPS: 75.0000 mg | ORAL_CAPSULE | Freq: Two times a day (BID) | ORAL | 0 refills | Status: DC

## 2023-08-27 NOTE — Discharge Instructions (Signed)
 Influenza test is positive.  You are considered contagious to others as long as you have a measurable fever with a temperature 100 F.  You should consider yourself infectious until you are fever free for 24 hours without fever lowering medications. Continue to alternate Tylenol and ibuprofen for management of fever.   Force fluids to maintain hydration. Tamiflu twice daily for the next 5 days to reduce symptoms and course of influenza virus.   If you develop any shortness of breath, wheezing or difficulty breathing go immediately to the nearest emergency department.

## 2023-08-27 NOTE — ED Triage Notes (Signed)
"  This started Wednesday night with chills, weak, cough with chest soreness, probable fever, sweats, all have remained through the day the chest soreness with the cough is improving". No COVID19 or Flu Vaccine this season. No PCP.

## 2023-08-27 NOTE — ED Provider Notes (Signed)
 [facility]    CSN: [id number] Arrival date & time: 08/27/23  1427      History   Chief Complaint Chief Complaint  Patient presents with   Fatigue   Nasal Congestion   Chills    HPI Ronald Vang is a 28 y.o. male.   HPI Patient with a history of asthma presents today for evaluation of flulike symptoms including generalized bodyaches, fever, cough, headache with mild congestion.  Symptoms have been present for almost 2 days.  He has had no known sick contacts.  Past Medical History:  Diagnosis Date   Asthma    COVID toes     Patient Active Problem List   Diagnosis Date Noted   Seasonal and perennial allergic rhinitis 10/08/2022   Pain of left sternoclavicular joint 01/22/2022   Seasonal allergic conjunctivitis 10/17/2018   Pain of right shoulder joint on movement 09/21/2017   Routine general medical examination at a health care facility 01/17/2014   Other general medical examination for administrative purposes 01/17/2014    History reviewed. No pertinent surgical history.     Home Medications    Prior to Admission medications   Medication Sig Start Date End Date Taking? Authorizing Provider  benzonatate (TESSALON) 100 MG capsule Take 2 capsules (200 mg total) by mouth 3 (three) times daily as needed for cough. 08/27/23  Yes Bing Neighbors, NP  cetirizine (ZYRTEC ALLERGY) 10 MG tablet Take 10 mg by mouth daily.   Yes [provider]  ibuprofen (ADVIL) 200 MG tablet Take 400 mg by mouth every 6 (six) hours as needed.   Yes [provider]  montelukast (SINGULAIR) 10 MG tablet TAKE 1 TABLET BY MOUTH EVERYDAY AT BEDTIME 10/08/22  Yes Ambs, Norvel Richards, FNP  oseltamivir (TAMIFLU) 75 MG capsule Take 1 capsule (75 mg total) by mouth 2 (two) times daily. 08/27/23  Yes Bing Neighbors, NP  azelastine (ASTELIN) 0.1 % nasal spray TAKE 1-2 SPRAYS TWICE A DAY AS NEEDED FOR RUNNY NOSE. 02/20/20   Ellamae Sia, DO  cetirizine (ZYRTEC) 10 MG tablet  Take 1 tablet (10 mg total) by mouth daily. 10/08/22   Hetty Blend, FNP  fluticasone (FLONASE) 50 MCG/ACT nasal spray Place 2 sprays into both nostrils daily. 08/25/21   Ellamae Sia, DO    Family History Family History  Problem Relation Age of Onset   Allergic rhinitis Mother     Social History Social History   Tobacco Use   Smoking status: Never    Passive exposure: Current   Smokeless tobacco: Never  Vaping Use   Vaping status: Never Used  Substance Use Topics   Alcohol use: Yes    Comment: social   Drug use: No     Allergies   Patient has no known allergies.   Review of Systems Review of Systems Pertinent negatives listed in HPI  Physical Exam Triage Vital Signs ED Triage Vitals  Encounter Vitals Group     BP 08/27/23 1444 112/75     Systolic BP Percentile --      Diastolic BP Percentile --      Pulse Rate 08/27/23 1444 88     Resp 08/27/23 1444 20     Temp 08/27/23 1444 100 F (37.8 C)     Temp Source 08/27/23 1444 Oral     SpO2 08/27/23 1444 96 %     Weight 08/27/23 1441 184 lb 15.5 oz (83.9 kg)     Height 08/27/23 1441 5'  4" (1.626 m)     Head Circumference --      Peak Flow --      Pain Score 08/27/23 1439 0     Pain Loc --      Pain Education --      Exclude from Growth Chart --    No data found.  Updated Vital Signs BP 112/75 (BP Location: Left Arm)   Pulse 88   Temp 100 F (37.8 C) (Oral)   Resp 20   Ht 5\' 4"  (1.626 m)   Wt 184 lb 15.5 oz (83.9 kg)   SpO2 96%   BMI 31.75 kg/m   Visual Acuity Right Eye Distance:   Left Eye Distance:   Bilateral Distance:    Right Eye Near:   Left Eye Near:    Bilateral Near:     Physical Exam  General Appearance:    Alert, ill-appearing, cooperative, no distress  HENT:   Normocephalic, Ears normal, nares mucosal edema with congestion, rhinorrhea, oropharynx  clear   Eyes:    PERRL, conjunctiva/corneas clear, EOM's intact       Lungs:     Clear to auscultation bilaterally, respirations  unlabored  Heart:    Regular rate and rhythm  Neurologic:   Awake, alert, oriented x 3. No apparent focal neurological           defect.      UC Treatments / Results  Labs (all labs ordered are listed, but only abnormal results are displayed) Labs Reviewed  POC COVID19/FLU A&B COMBO - Abnormal; Notable for the following components:      Result Value   Influenza A Antigen, POC Positive (*)    All other components within normal limits    EKG   Radiology No results found.  Procedures Procedures (including critical care time)  Medications Ordered in UC Medications - No data to display  Initial Impression / Assessment and Plan / UC Course  I have reviewed the triage vital signs and the nursing notes.  Pertinent labs & imaging results that were available during my care of the patient were reviewed by me and considered in my medical decision making (see chart for details).    Influenza A, Tamiflu treatment dose prescribed. Benzonatate Perles prescribed for cough. Strict return precautions given if symptoms worsen or do not improve Final Clinical Impressions(s) / UC Diagnoses   Final diagnoses:  Influenza A     Discharge Instructions      Influenza test is positive.  You are considered contagious to others as long as you have a measurable fever with a temperature 100 F.  You should consider yourself infectious until you are fever free for 24 hours without fever lowering medications. Continue to alternate Tylenol and ibuprofen for management of fever.   Force fluids to maintain hydration. Tamiflu twice daily for the next 5 days to reduce symptoms and course of influenza virus.   If you develop any shortness of breath, wheezing or difficulty breathing go immediately to the nearest emergency department.       ED Prescriptions     Medication Sig Dispense Auth. Provider   oseltamivir (TAMIFLU) 75 MG capsule Take 1 capsule (75 mg total) by mouth 2 (two) times daily. 10  capsule Bing Neighbors, NP   benzonatate (TESSALON) 100 MG capsule Take 2 capsules (200 mg total) by mouth 3 (three) times daily as needed for cough. 30 capsule Bing Neighbors, NP      PDMP not  reviewed this encounter.   Bing Neighbors, NP 08/27/23 [phone]

## 2023-08-30 ENCOUNTER — Other Ambulatory Visit: Payer: Self-pay

## 2023-08-30 ENCOUNTER — Encounter (HOSPITAL_BASED_OUTPATIENT_CLINIC_OR_DEPARTMENT_OTHER): Payer: Self-pay

## 2023-08-30 DIAGNOSIS — K3589 Other acute appendicitis without perforation or gangrene: Secondary | ICD-10-CM | POA: Insufficient documentation

## 2023-08-30 LAB — CBC WITH DIFFERENTIAL/PLATELET
Abs Immature Granulocytes: 0.04 10*3/uL (ref 0.00–0.07)
Basophils Absolute: 0 10*3/uL (ref 0.0–0.1)
Basophils Relative: 0 %
Eosinophils Absolute: 0.1 10*3/uL (ref 0.0–0.5)
Eosinophils Relative: 1 %
HCT: 43.5 % (ref 39.0–52.0)
Hemoglobin: 15.1 g/dL (ref 13.0–17.0)
Immature Granulocytes: 0 %
Lymphocytes Relative: 16 %
Lymphs Abs: 1.8 10*3/uL (ref 0.7–4.0)
MCH: 29.3 pg (ref 26.0–34.0)
MCHC: 34.7 g/dL (ref 30.0–36.0)
MCV: 84.5 fL (ref 80.0–100.0)
Monocytes Absolute: 0.7 10*3/uL (ref 0.1–1.0)
Monocytes Relative: 7 %
Neutro Abs: 8.5 10*3/uL — ABNORMAL HIGH (ref 1.7–7.7)
Neutrophils Relative %: 76 %
Platelets: 178 10*3/uL (ref 150–400)
RBC: 5.15 MIL/uL (ref 4.22–5.81)
RDW: 12.1 % (ref 11.5–15.5)
WBC: 11.1 10*3/uL — ABNORMAL HIGH (ref 4.0–10.5)
nRBC: 0 % (ref 0.0–0.2)

## 2023-08-30 NOTE — ED Triage Notes (Signed)
 RLQ pain x 1 day pain is worse with movement  denies recent fevers, n/v or diarrhea denies dysuria

## 2023-08-31 ENCOUNTER — Other Ambulatory Visit: Payer: Self-pay

## 2023-08-31 ENCOUNTER — Emergency Department (HOSPITAL_BASED_OUTPATIENT_CLINIC_OR_DEPARTMENT_OTHER)

## 2023-08-31 ENCOUNTER — Encounter (HOSPITAL_COMMUNITY): Payer: Self-pay

## 2023-08-31 ENCOUNTER — Emergency Department (HOSPITAL_COMMUNITY): Admitting: Anesthesiology

## 2023-08-31 ENCOUNTER — Encounter (HOSPITAL_COMMUNITY)
Admission: EM | Disposition: A | Discharge status: Home / Self Care | Payer: Self-pay | Source: Home / Self Care | Attending: Emergency Medicine

## 2023-08-31 ENCOUNTER — Ambulatory Visit (HOSPITAL_BASED_OUTPATIENT_CLINIC_OR_DEPARTMENT_OTHER)
Admission: EM | Admit: 2023-08-31 | Discharge: 2023-08-31 | Disposition: A | Discharge status: Home / Self Care | Attending: Emergency Medicine | Admitting: Emergency Medicine

## 2023-08-31 ENCOUNTER — Emergency Department (HOSPITAL_BASED_OUTPATIENT_CLINIC_OR_DEPARTMENT_OTHER): Admitting: Anesthesiology

## 2023-08-31 DIAGNOSIS — K353 Acute appendicitis with localized peritonitis, without perforation or gangrene: Secondary | ICD-10-CM

## 2023-08-31 DIAGNOSIS — K3580 Unspecified acute appendicitis: Secondary | ICD-10-CM

## 2023-08-31 HISTORY — PX: LAPAROSCOPIC APPENDECTOMY: SHX408

## 2023-08-31 LAB — COMPREHENSIVE METABOLIC PANEL
ALT: 36 U/L (ref 0–44)
AST: 27 U/L (ref 15–41)
Albumin: 4.3 g/dL (ref 3.5–5.0)
Alkaline Phosphatase: 57 U/L (ref 38–126)
Anion gap: 10 (ref 5–15)
BUN: 11 mg/dL (ref 6–20)
CO2: 24 mmol/L (ref 22–32)
Calcium: 8.6 mg/dL — ABNORMAL LOW (ref 8.9–10.3)
Chloride: 101 mmol/L (ref 98–111)
Creatinine, Ser: 0.75 mg/dL (ref 0.61–1.24)
GFR, Estimated: >60 mL/min (ref >60)
Glucose, Bld: 190 mg/dL — ABNORMAL HIGH (ref 70–99)
Potassium: 3.5 mmol/L (ref 3.5–5.1)
Sodium: 135 mmol/L (ref 135–145)
Total Bilirubin: 1.2 mg/dL (ref 0.0–1.2)
Total Protein: 7.3 g/dL (ref 6.5–8.1)

## 2023-08-31 LAB — URINALYSIS, ROUTINE W REFLEX MICROSCOPIC
Bilirubin Urine: NEGATIVE
Glucose, UA: NEGATIVE mg/dL
Hgb urine dipstick: NEGATIVE
Ketones, ur: NEGATIVE mg/dL
Leukocytes,Ua: NEGATIVE
Nitrite: NEGATIVE
Protein, ur: NEGATIVE mg/dL
Specific Gravity, Urine: 1.02 (ref 1.005–1.030)
pH: 7 (ref 5.0–8.0)

## 2023-08-31 SURGERY — APPENDECTOMY, LAPAROSCOPIC
Anesthesia: General

## 2023-08-31 MED ORDER — DROPERIDOL 2.5 MG/ML IJ SOLN: 0.6250 mg | Freq: Once | INTRAMUSCULAR | Status: DC | PRN

## 2023-08-31 MED ORDER — SCOPOLAMINE 1 MG/3DAYS TD PT72: 1.0000 | MEDICATED_PATCH | TRANSDERMAL | Status: DC

## 2023-08-31 MED ORDER — BUPIVACAINE-EPINEPHRINE (PF) 0.25% -1:200000 IJ SOLN
INTRAMUSCULAR | Status: AC
  Filled 2023-08-31: qty 30

## 2023-08-31 MED ORDER — CHLORHEXIDINE GLUCONATE 0.12 % MT SOLN
15.0000 mL | Freq: Once | OROMUCOSAL | Status: AC
  Administered 2023-08-31: 15 mL via OROMUCOSAL

## 2023-08-31 MED ORDER — ACETAMINOPHEN 10 MG/ML IV SOLN
INTRAVENOUS | Status: AC
  Filled 2023-08-31: qty 100

## 2023-08-31 MED ORDER — ONDANSETRON HCL 4 MG/2ML IJ SOLN
INTRAMUSCULAR | Status: DC | PRN
  Administered 2023-08-31: 4 mg via INTRAVENOUS

## 2023-08-31 MED ORDER — DEXAMETHASONE SODIUM PHOSPHATE 10 MG/ML IJ SOLN
INTRAMUSCULAR | Status: AC
  Filled 2023-08-31: qty 1

## 2023-08-31 MED ORDER — ONDANSETRON HCL 4 MG/2ML IJ SOLN
INTRAMUSCULAR | Status: AC
  Filled 2023-08-31: qty 2

## 2023-08-31 MED ORDER — OXYCODONE HCL 5 MG PO TABS
5.0000 mg | ORAL_TABLET | Freq: Once | ORAL | Status: AC | PRN
  Administered 2023-08-31: 5 mg via ORAL

## 2023-08-31 MED ORDER — MIDAZOLAM HCL 2 MG/2ML IJ SOLN
INTRAMUSCULAR | Status: DC | PRN
  Administered 2023-08-31: 2 mg via INTRAVENOUS

## 2023-08-31 MED ORDER — OXYCODONE HCL 5 MG/5ML PO SOLN: 5.0000 mg | Freq: Once | ORAL | Status: AC | PRN

## 2023-08-31 MED ORDER — SUGAMMADEX SODIUM 200 MG/2ML IV SOLN
INTRAVENOUS | Status: DC | PRN
  Administered 2023-08-31: 200 mg via INTRAVENOUS

## 2023-08-31 MED ORDER — SODIUM CHLORIDE 0.9 % IV SOLN
2.0000 g | Freq: Once | INTRAVENOUS | Status: AC
  Administered 2023-08-31: 2 g via INTRAVENOUS

## 2023-08-31 MED ORDER — HYDROCODONE-ACETAMINOPHEN 5-325 MG PO TABS: 1.0000 | ORAL_TABLET | Freq: Four times a day (QID) | ORAL | 0 refills | Status: DC | PRN

## 2023-08-31 MED ORDER — ACETAMINOPHEN 325 MG PO TABS: 325.0000 mg | ORAL_TABLET | ORAL | Status: DC | PRN

## 2023-08-31 MED ORDER — ROCURONIUM BROMIDE 10 MG/ML (PF) SYRINGE
PREFILLED_SYRINGE | INTRAVENOUS | Status: DC | PRN
Start: 2023-08-31 — End: 2023-08-31
  Administered 2023-08-31: 50 mg via INTRAVENOUS

## 2023-08-31 MED ORDER — PROPOFOL 10 MG/ML IV BOLUS
INTRAVENOUS | Status: DC | PRN
  Administered 2023-08-31: 200 mg via INTRAVENOUS

## 2023-08-31 MED ORDER — KETOROLAC TROMETHAMINE 30 MG/ML IJ SOLN
INTRAMUSCULAR | Status: AC
  Filled 2023-08-31: qty 1

## 2023-08-31 MED ORDER — LACTATED RINGERS IV SOLN: INTRAVENOUS | Status: DC

## 2023-08-31 MED ORDER — ACETAMINOPHEN 10 MG/ML IV SOLN: 1000.0000 mg | Freq: Once | INTRAVENOUS | Status: DC | PRN

## 2023-08-31 MED ORDER — SODIUM CHLORIDE 0.9 % IV SOLN
2.0000 g | Freq: Once | INTRAVENOUS | Status: AC
  Administered 2023-08-31: 2 g via INTRAVENOUS
  Filled 2023-08-31: qty 20

## 2023-08-31 MED ORDER — ROCURONIUM BROMIDE 10 MG/ML (PF) SYRINGE
PREFILLED_SYRINGE | INTRAVENOUS | Status: AC
  Filled 2023-08-31: qty 10

## 2023-08-31 MED ORDER — OXYCODONE HCL 5 MG PO TABS
ORAL_TABLET | ORAL | Status: AC
  Filled 2023-08-31: qty 1

## 2023-08-31 MED ORDER — METRONIDAZOLE 500 MG/100ML IV SOLN
500.0000 mg | Freq: Once | INTRAVENOUS | Status: AC
  Administered 2023-08-31: 500 mg via INTRAVENOUS
  Filled 2023-08-31: qty 100

## 2023-08-31 MED ORDER — LIDOCAINE HCL (CARDIAC) PF 100 MG/5ML IV SOSY
PREFILLED_SYRINGE | INTRAVENOUS | Status: DC | PRN
  Administered 2023-08-31: 60 mg via INTRAVENOUS

## 2023-08-31 MED ORDER — ACETAMINOPHEN 500 MG PO TABS: 1000.0000 mg | ORAL_TABLET | ORAL | Status: DC

## 2023-08-31 MED ORDER — PROPOFOL 10 MG/ML IV BOLUS
INTRAVENOUS | Status: AC
  Filled 2023-08-31: qty 20

## 2023-08-31 MED ORDER — ACETAMINOPHEN 160 MG/5ML PO SOLN: 325.0000 mg | ORAL | Status: DC | PRN

## 2023-08-31 MED ORDER — ORAL CARE MOUTH RINSE: 15.0000 mL | Freq: Once | OROMUCOSAL | Status: AC

## 2023-08-31 MED ORDER — LACTATED RINGERS IR SOLN
Status: DC | PRN
  Administered 2023-08-31: 1000 mL

## 2023-08-31 MED ORDER — MORPHINE SULFATE (PF) 4 MG/ML IV SOLN
4.0000 mg | Freq: Once | INTRAVENOUS | Status: AC
  Administered 2023-08-31: 4 mg via INTRAVENOUS
  Filled 2023-08-31: qty 1

## 2023-08-31 MED ORDER — SUGAMMADEX SODIUM 200 MG/2ML IV SOLN
INTRAVENOUS | Status: AC
  Filled 2023-08-31: qty 2

## 2023-08-31 MED ORDER — FENTANYL CITRATE PF 50 MCG/ML IJ SOSY: 25.0000 ug | PREFILLED_SYRINGE | INTRAMUSCULAR | Status: DC | PRN

## 2023-08-31 MED ORDER — SODIUM CHLORIDE 0.9 % IV SOLN
INTRAVENOUS | Status: AC
  Filled 2023-08-31: qty 2

## 2023-08-31 MED ORDER — DEXAMETHASONE SODIUM PHOSPHATE 10 MG/ML IJ SOLN
INTRAMUSCULAR | Status: DC | PRN
  Administered 2023-08-31: 10 mg via INTRAVENOUS

## 2023-08-31 MED ORDER — IOHEXOL 300 MG/ML  SOLN
100.0000 mL | Freq: Once | INTRAMUSCULAR | Status: AC | PRN
  Administered 2023-08-31: 100 mL via INTRAVENOUS

## 2023-08-31 MED ORDER — MIDAZOLAM HCL 2 MG/2ML IJ SOLN
INTRAMUSCULAR | Status: AC
  Filled 2023-08-31: qty 2

## 2023-08-31 MED ORDER — FENTANYL CITRATE (PF) 250 MCG/5ML IJ SOLN
INTRAMUSCULAR | Status: AC
  Filled 2023-08-31: qty 5

## 2023-08-31 MED ORDER — FENTANYL CITRATE (PF) 250 MCG/5ML IJ SOLN
INTRAMUSCULAR | Status: DC | PRN
  Administered 2023-08-31 (×2): 100 ug via INTRAVENOUS
  Administered 2023-08-31: 50 ug via INTRAVENOUS

## 2023-08-31 MED ORDER — LIDOCAINE HCL (PF) 2 % IJ SOLN
INTRAMUSCULAR | Status: AC
  Filled 2023-08-31: qty 5

## 2023-08-31 MED ORDER — ACETAMINOPHEN 10 MG/ML IV SOLN
INTRAVENOUS | Status: DC | PRN
  Administered 2023-08-31: 1000 mg via INTRAVENOUS

## 2023-08-31 MED ORDER — BUPIVACAINE-EPINEPHRINE (PF) 0.25% -1:200000 IJ SOLN
INTRAMUSCULAR | Status: DC | PRN
  Administered 2023-08-31: 20 mL

## 2023-08-31 MED ORDER — MORPHINE SULFATE (PF) 2 MG/ML IV SOLN: 2.0000 mg | INTRAVENOUS | Status: DC | PRN

## 2023-08-31 MED ORDER — ONDANSETRON HCL 4 MG/2ML IJ SOLN
4.0000 mg | Freq: Once | INTRAMUSCULAR | Status: AC
  Administered 2023-08-31: 4 mg via INTRAVENOUS
  Filled 2023-08-31: qty 2

## 2023-08-31 SURGICAL SUPPLY — 42 items
APPLIER CLIP 5 13 M/L LIGAMAX5 (MISCELLANEOUS) ×1 IMPLANT
APPLIER CLIP ROT 10 11.4 M/L (STAPLE) IMPLANT
BAG COUNTER SPONGE SURGICOUNT (BAG) ×1 IMPLANT
CABLE HIGH FREQUENCY MONO STRZ (ELECTRODE) IMPLANT
CHLORAPREP W/TINT 26 (MISCELLANEOUS) ×1 IMPLANT
CLIP APPLIE 5 13 M/L LIGAMAX5 (MISCELLANEOUS) IMPLANT
CLIP APPLIE ROT 10 11.4 M/L (STAPLE) IMPLANT
COVER SURGICAL LIGHT HANDLE (MISCELLANEOUS) ×1 IMPLANT
CUTTER FLEX LINEAR 45M (STAPLE) ×1 IMPLANT
DERMABOND ADVANCED .7 DNX12 (GAUZE/BANDAGES/DRESSINGS) ×1 IMPLANT
DRAIN CHANNEL 19F RND (DRAIN) IMPLANT
ELECT PENCIL ROCKER SW 15FT (MISCELLANEOUS) ×1 IMPLANT
ELECT REM PT RETURN 15FT ADLT (MISCELLANEOUS) ×1 IMPLANT
ENDOLOOP SUT PDS II 0 18 (SUTURE) IMPLANT
EVACUATOR SILICONE 100CC (DRAIN) IMPLANT
GLOVE BIO SURGEON STRL SZ7.5 (GLOVE) ×1 IMPLANT
GLOVE INDICATOR 8.0 STRL GRN (GLOVE) ×1 IMPLANT
GOWN STRL REUS W/ TWL XL LVL3 (GOWN DISPOSABLE) ×2 IMPLANT
IRRIG SUCT STRYKERFLOW 2 WTIP (MISCELLANEOUS) ×1 IMPLANT
IRRIGATION SUCT STRKRFLW 2 WTP (MISCELLANEOUS) ×1 IMPLANT
KIT BASIN OR (CUSTOM PROCEDURE TRAY) ×1 IMPLANT
KIT TURNOVER KIT A (KITS) IMPLANT
PAD POSITIONING PINK XL (MISCELLANEOUS) ×1 IMPLANT
RELOAD 45 VASCULAR/THIN (ENDOMECHANICALS) IMPLANT
RELOAD STAPLE 45 2.5 WHT GRN (ENDOMECHANICALS) IMPLANT
RELOAD STAPLE 45 3.5 BLU ETS (ENDOMECHANICALS) IMPLANT
RELOAD STAPLE TA45 3.5 REG BLU (ENDOMECHANICALS) ×1 IMPLANT
SCISSORS LAP 5X35 DISP (ENDOMECHANICALS) IMPLANT
SET TUBE SMOKE EVAC HIGH FLOW (TUBING) ×1 IMPLANT
SHEARS HARMONIC 36 ACE (MISCELLANEOUS) ×1 IMPLANT
SLEEVE ADV FIXATION 5X100MM (TROCAR) ×1 IMPLANT
SPIKE FLUID TRANSFER (MISCELLANEOUS) ×1 IMPLANT
SUT ETHILON 3 0 PS 1 (SUTURE) IMPLANT
SUT MNCRL AB 4-0 PS2 18 (SUTURE) ×1 IMPLANT
SYS BAG RETRIEVAL 10MM (BASKET) ×1 IMPLANT
SYSTEM BAG RETRIEVAL 10MM (BASKET) ×1 IMPLANT
TOWEL OR 17X26 10 PK STRL BLUE (TOWEL DISPOSABLE) IMPLANT
TRAY FOLEY MTR SLVR 14FR STAT (SET/KITS/TRAYS/PACK) ×1 IMPLANT
TRAY FOLEY MTR SLVR 16FR STAT (SET/KITS/TRAYS/PACK) ×1 IMPLANT
TRAY LAPAROSCOPIC (CUSTOM PROCEDURE TRAY) ×1 IMPLANT
TROCAR ADV FIXATION 5X100MM (TROCAR) ×1 IMPLANT
TROCAR BALLN 12MMX100 BLUNT (TROCAR) ×1 IMPLANT

## 2023-08-31 NOTE — ED Provider Notes (Signed)
 [facility] Provider Note   CSN: [id number] Arrival date & time: 08/30/23  2337     History  Chief Complaint  Patient presents with   Abdominal Pain    RLQ pain x 1 day    Ronald Vang is a 28 y.o. male.  28 yo M with a chief complaint of right lower quadrant abdominal pain.  Going on for about 6 hours now.  Worse with certain positions and movements.  No fevers no vomiting no nausea no diarrhea.  Atraumatic.   Abdominal Pain      Home Medications Prior to Admission medications   Medication Sig Start Date End Date Taking? Authorizing Provider  azelastine (ASTELIN) 0.1 % nasal spray TAKE 1-2 SPRAYS TWICE A DAY AS NEEDED FOR RUNNY NOSE. 02/20/20   Ellamae Sia, DO  benzonatate (TESSALON) 100 MG capsule Take 2 capsules (200 mg total) by mouth 3 (three) times daily as needed for cough. 08/27/23   Bing Neighbors, NP  cetirizine (ZYRTEC ALLERGY) 10 MG tablet Take 10 mg by mouth daily.    [provider]  cetirizine (ZYRTEC) 10 MG tablet Take 1 tablet (10 mg total) by mouth daily. 10/08/22   Hetty Blend, FNP  fluticasone (FLONASE) 50 MCG/ACT nasal spray Place 2 sprays into both nostrils daily. 08/25/21   Ellamae Sia, DO  ibuprofen (ADVIL) 200 MG tablet Take 400 mg by mouth every 6 (six) hours as needed.    [provider]  montelukast (SINGULAIR) 10 MG tablet TAKE 1 TABLET BY MOUTH EVERYDAY AT BEDTIME 10/08/22   Ambs, Norvel Richards, FNP  oseltamivir (TAMIFLU) 75 MG capsule Take 1 capsule (75 mg total) by mouth 2 (two) times daily. 08/27/23   Bing Neighbors, NP      Allergies    Patient has no known allergies.    Review of Systems   Review of Systems  Gastrointestinal:  Positive for abdominal pain.    Physical Exam Updated Vital Signs BP 108/64 (BP Location: Right Arm)   Pulse 74   Temp 97.7 F (36.5 C) (Oral)   Resp 16   Ht 5\' 4"  (1.626 m)   Wt 81.6 kg   SpO2 94%   BMI 30.90 kg/m  Physical Exam Vitals and  nursing note reviewed.  Constitutional:      Appearance: He is well-developed.  HENT:     Head: Normocephalic and atraumatic.  Eyes:     Pupils: Pupils are equal, round, and reactive to light.  Neck:     Vascular: No JVD.  Cardiovascular:     Rate and Rhythm: Normal rate and regular rhythm.     Heart sounds: No murmur heard.    No friction rub. No gallop.  Pulmonary:     Effort: No respiratory distress.     Breath sounds: No wheezing.  Abdominal:     General: There is no distension.     Tenderness: There is abdominal tenderness. There is no guarding or rebound.     Comments: Focal tenderness to the right lower quadrant with guarding.  Negative rebound negative Rovsing's negative obturator  Musculoskeletal:        General: Normal range of motion.     Cervical back: Normal range of motion and neck supple.  Skin:    Coloration: Skin is not pale.     Findings: No rash.  Neurological:     Mental Status: He is alert and oriented to person, place, and  time.  Psychiatric:        Behavior: Behavior normal.     ED Results / Procedures / Treatments   Labs (all labs ordered are listed, but only abnormal results are displayed) Labs Reviewed  CBC WITH DIFFERENTIAL/PLATELET - Abnormal; Notable for the following components:      Result Value   WBC 11.1 (*)    Neutro Abs 8.5 (*)    All other components within normal limits  COMPREHENSIVE METABOLIC PANEL - Abnormal; Notable for the following components:   Glucose, Bld 190 (*)    Calcium 8.6 (*)    All other components within normal limits  URINALYSIS, ROUTINE W REFLEX MICROSCOPIC    EKG None  Radiology CT ABDOMEN PELVIS W CONTRAST Result Date: 08/31/2023 CLINICAL DATA:  Right lower quadrant pain worse with movement EXAM: CT ABDOMEN AND PELVIS WITH CONTRAST TECHNIQUE: Multidetector CT imaging of the abdomen and pelvis was performed using the standard protocol following bolus administration of intravenous contrast. RADIATION DOSE  REDUCTION: This exam was performed according to the departmental dose-optimization program which includes automated exposure control, adjustment of the mA and/or kV according to patient size and/or use of iterative reconstruction technique. CONTRAST:  OMNIPAQUE IOHEXOL 300 MG/ML  SOLN COMPARISON:  None Available. FINDINGS: Lower chest: No acute abnormality. Hepatobiliary: Unremarkable liver. Normal gallbladder. No biliary dilation. Pancreas: Unremarkable. Spleen: Unremarkable. Adrenals/Urinary Tract: Normal adrenal glands. No urinary calculi or hydronephrosis. Bladder is unremarkable. Stomach/Bowel: Normal caliber large and small bowel. No bowel wall thickening. Stomach is within normal limits. Dilated appendix measuring 10 mm. Adjacent inflammatory stranding and trace free fluid. No abscess or perforation. Vascular/Lymphatic: No significant vascular findings are present. No enlarged abdominal or pelvic lymph nodes. Reproductive: Unremarkable. Other: No abscess or free intraperitoneal air. Musculoskeletal: No acute fracture. IMPRESSION: Acute uncomplicated appendicitis. Electronically Signed   By: Minerva Fester M.D.   On: 08/31/2023 03:38    Procedures Procedures    Medications Ordered in ED Medications  morphine (PF) 4 MG/ML injection 4 mg (4 mg Intravenous Given 08/31/23 0238)  ondansetron (ZOFRAN) injection 4 mg (4 mg Intravenous Given 08/31/23 0237)  iohexol (OMNIPAQUE) 300 MG/ML solution 100 mL (100 mLs Intravenous Contrast Given 08/31/23 0245)  cefTRIAXone (ROCEPHIN) 2 g in sodium chloride 0.9 % 100 mL IVPB (0 g Intravenous Stopped 08/31/23 0438)    And  metroNIDAZOLE (FLAGYL) IVPB 500 mg (0 mg Intravenous Stopped 08/31/23 0630)    ED Course/ Medical Decision Making/ A&P                                 Medical Decision Making Amount and/or Complexity of Data Reviewed Labs: ordered. Radiology: ordered.  Risk Prescription drug management.   28 yo M with a chief complaints of right  lower quadrant abdominal discomfort.  Going on for about 6 hours now.  Focal tenderness to McBurney's point.  Leukocytosis with neutrophilic predominance.  No significant electrolyte abnormalities.  Will obtain CT imaging.  CT scan with appendicitis.  Started on antibiotics per our protocol.  Discussed case with Dr. Donell Beers, general surgery recommended ED to ED transfer to [facility].  I discussed with Dr. Posey Rea, accepts in transfer.  The patients results and plan were reviewed and discussed.   Any x-rays performed were independently reviewed by myself.   Differential diagnosis were considered with the presenting HPI.  Medications  morphine (PF) 4 MG/ML injection 4 mg (4 mg Intravenous Given 08/31/23  2440)  ondansetron (ZOFRAN) injection 4 mg (4 mg Intravenous Given 08/31/23 0237)  iohexol (OMNIPAQUE) 300 MG/ML solution 100 mL (100 mLs Intravenous Contrast Given 08/31/23 0245)  cefTRIAXone (ROCEPHIN) 2 g in sodium chloride 0.9 % 100 mL IVPB (0 g Intravenous Stopped 08/31/23 0438)    And  metroNIDAZOLE (FLAGYL) IVPB 500 mg (0 mg Intravenous Stopped 08/31/23 0630)    Vitals:   08/30/23 2348 08/31/23 0202 08/31/23 0541 08/31/23 0542  BP:  129/80 108/64   Pulse:  70 (!) 54 74  Resp:  18 16   Temp:  98.2 F (36.8 C) 97.7 F (36.5 C)   TempSrc:   Oral   SpO2:  97% 99% 94%  Weight: 81.6 kg     Height: 5\' 4"  (1.626 m)       Final diagnoses:  Acute appendicitis with localized peritonitis, without perforation, abscess, or gangrene    Admission/ observation were discussed with the admitting physician, patient and/or family and they are comfortable with the plan.          Final Clinical Impression(s) / ED Diagnoses Final diagnoses:  Acute appendicitis with localized peritonitis, without perforation, abscess, or gangrene    Rx / DC Orders ED Discharge Orders     None         Melene Plan, DO 08/31/23 1027

## 2023-08-31 NOTE — H&P (Addendum)
 Ronald Vang 10-01-1995  [id number].    Requesting MD: Adela Lank, MD Chief Complaint/Reason for Consult: appendicitis   HPI:  Ronald Vang is a 28 y/o M who presents with abdominal pain. Reports he felt nauseated around 2 PM yesterday and then developed RLQ abdominal pain. Pain described as constant and non-radiating. Denies alleviating factors. He denies associated fever, chills, vomiting, diarrhea, or constipation. Last PO intake was a PB&J sandwich last night. He takes daily allergy medication. Denies use of blood thinners. NKDA. Denies tobacco use. He has never had abdominal surgery. His girlfriend is at the bedside with him. He is currently employed by the fire department.  ROS: Review of Systems  All other systems reviewed and are negative.   Family History  Problem Relation Age of Onset   Allergic rhinitis Mother     Past Medical History:  Diagnosis Date   Asthma    COVID toes     History reviewed. No pertinent surgical history.  Social History:  reports that he has never smoked. He has been exposed to tobacco smoke. He has never used smokeless tobacco. He reports current alcohol use. He reports that he does not use drugs.  Allergies: No Known Allergies  (Not in a hospital admission)    Physical Exam: General: Pleasant male laying on hospital bed, appears stated age, NAD. HEENT: head -normocephalic, atraumatic; Eyes: PERRLA, no conjunctival injection; anicteric sclerae. Checks appear flush. Normal speech.  Neck- Trachea is midline, no thyromegaly or JVD appreciated.  CV- RRR, normal S1/S2, no M/R/G, no lower extremity edema . Pulm- breathing is non-labored ORA, no cough Abd- soft, mild distention, focally tender to palpation in the RLQ without rebound tenderness or guarding. No peritonitis. No hernias. GU- deferred  MSK- UE/LE symmetrical, no cyanosis, clubbing, or edema. Neuro- CN II-XII grossly in tact, no paresthesias. Psych- Alert and Oriented x3 with  appropriate affect Skin: warm and dry, no rashes or lesions   Results for orders placed or performed during the hospital encounter of 08/31/23 (from the past 48 hours)  CBC with Differential     Status: Abnormal   Collection Time: 08/30/23 11:46 PM  Result Value Ref Range   WBC 11.1 (H) 4.0 - 10.5 K/uL   RBC 5.15 4.22 - 5.81 MIL/uL   Hemoglobin 15.1 13.0 - 17.0 g/dL   HCT 95.6 21.3 - 08.6 %   MCV 84.5 80.0 - 100.0 fL   MCH 29.3 26.0 - 34.0 pg   MCHC 34.7 30.0 - 36.0 g/dL   RDW 57.8 46.9 - 62.9 %   Platelets 178 150 - 400 K/uL   nRBC 0.0 0.0 - 0.2 %   Neutrophils Relative % 76 %   Neutro Abs 8.5 (H) 1.7 - 7.7 K/uL   Lymphocytes Relative 16 %   Lymphs Abs 1.8 0.7 - 4.0 K/uL   Monocytes Relative 7 %   Monocytes Absolute 0.7 0.1 - 1.0 K/uL   Eosinophils Relative 1 %   Eosinophils Absolute 0.1 0.0 - 0.5 K/uL   Basophils Relative 0 %   Basophils Absolute 0.0 0.0 - 0.1 K/uL   Immature Granulocytes 0 %   Abs Immature Granulocytes 0.04 0.00 - 0.07 K/uL    Comment: Performed at [facility], [address] [place], [place] [address]  Comprehensive metabolic panel     Status: Abnormal   Collection Time: 08/30/23 11:46 PM  Result Value Ref Range   Sodium 135 135 - 145 mmol/L   Potassium 3.5  3.5 - 5.1 mmol/L   Chloride 101 98 - 111 mmol/L   CO2 24 22 - 32 mmol/L   Glucose, Bld 190 (H) 70 - 99 mg/dL    Comment: Glucose reference range applies only to samples taken after fasting for at least 8 hours.   BUN 11 6 - 20 mg/dL   Creatinine, Ser 1.61 0.61 - 1.24 mg/dL   Calcium 8.6 (L) 8.9 - 10.3 mg/dL   Total Protein 7.3 6.5 - 8.1 g/dL   Albumin 4.3 3.5 - 5.0 g/dL   AST 27 15 - 41 U/L   ALT 36 0 - 44 U/L   Alkaline Phosphatase 57 38 - 126 U/L   Total Bilirubin 1.2 0.0 - 1.2 mg/dL   GFR, Estimated >09 >60 mL/min    Comment: (NOTE) Calculated using the CKD-EPI Creatinine Equation (2021)    Anion gap 10 5 - 15    Comment: Performed at [facility], [address] [place], [place] [address]  Urinalysis, Routine w reflex microscopic -Urine, Clean Catch     Status: None   Collection Time: 08/30/23 11:46 PM  Result Value Ref Range   Color, Urine YELLOW YELLOW   APPearance CLEAR CLEAR   Specific Gravity, Urine 1.020 1.005 - 1.030   pH 7.0 5.0 - 8.0   Glucose, UA NEGATIVE NEGATIVE mg/dL   Hgb urine dipstick NEGATIVE NEGATIVE   Bilirubin Urine NEGATIVE NEGATIVE   Ketones, ur NEGATIVE NEGATIVE mg/dL   Protein, ur NEGATIVE NEGATIVE mg/dL   Nitrite NEGATIVE NEGATIVE   Leukocytes,Ua NEGATIVE NEGATIVE    Comment: Microscopic not done on urines with negative protein, blood, leukocytes, nitrite, or glucose < 500 mg/dL. Performed at [facility], [address] [place], [place] [address]    CT ABDOMEN PELVIS W CONTRAST Result Date: 08/31/2023 CLINICAL DATA:  Right lower quadrant pain worse with movement EXAM: CT ABDOMEN AND PELVIS WITH CONTRAST TECHNIQUE: Multidetector CT imaging of the abdomen and pelvis was performed using the standard protocol following bolus administration of intravenous contrast. RADIATION DOSE REDUCTION: This exam was performed according to the departmental dose-optimization program which includes automated exposure control, adjustment of the mA and/or kV according to patient size and/or use of iterative reconstruction technique. CONTRAST:  OMNIPAQUE IOHEXOL 300 MG/ML  SOLN COMPARISON:  None Available. FINDINGS: Lower chest: No acute abnormality. Hepatobiliary: Unremarkable liver. Normal gallbladder. No biliary dilation. Pancreas: Unremarkable. Spleen: Unremarkable. Adrenals/Urinary Tract: Normal adrenal glands. No urinary calculi or hydronephrosis. Bladder is unremarkable. Stomach/Bowel: Normal caliber large and small bowel. No bowel wall thickening. Stomach is within normal limits. Dilated appendix measuring 10 mm. Adjacent inflammatory stranding and trace free fluid. No abscess or perforation. Vascular/Lymphatic: No  significant vascular findings are present. No enlarged abdominal or pelvic lymph nodes. Reproductive: Unremarkable. Other: No abscess or free intraperitoneal air. Musculoskeletal: No acute fracture. IMPRESSION: Acute uncomplicated appendicitis. Electronically Signed   By: Minerva Fester M.D.   On: 08/31/2023 03:38    Assessment/Plan Acute appendicitis without evidence of perforation. AFVSS, WBC 11, CT shows appendicitis without appendicolith or perforation Received Rocephin/Flagyl this morning.   The operative and non-operative management of appendicitis was discussed with the patient. Risks of surgery including bleeding, infection, damage to surrounding structures, conversion to open, drain placement, need for additional procedures, prolonged hospital stay, as well as the risks of general anesthesia were discussed with the patient and he would like to proceed with surgery. Questions were welcomed and answered.    FEN -  NPO, IVF VTE - SCD's ID - Rocephin/Flagyl given this AM Admit - plan discharge from PACU vs admit to [facility] service for obs  Has seasonal allergies, NKDA.   I reviewed nursing notes, ED provider notes, last 24 h vitals and pain scores, last 48 h intake and output, last 24 h labs and trends, and last 24 h imaging results.  Adam Phenix, [facility] 08/31/2023, 9:27 AM Please see Amion for pager number during day hours 7:00am-4:30pm or 7:00am -11:30am on weekends

## 2023-08-31 NOTE — ED Notes (Signed)
 [facility] called for transport.

## 2023-08-31 NOTE — ED Provider Notes (Signed)
 Care assumed from EDP Dr. Adela Lank, patient transferred from [facility] ED to ED for surgical management of acute appendicitis.   Briefly: Patient with RLQ pain x 6 hours. Physical Exam  BP 108/64 (BP Location: Right Arm)   Pulse 74   Temp 97.7 F (36.5 C) (Oral)   Resp 16   Ht 5\' 4"  (1.626 m)   Wt 81.6 kg   SpO2 94%   BMI 30.90 kg/m   Physical Exam Vitals and nursing note reviewed.  Constitutional:      General: He is not in acute distress.    Appearance: Normal appearance. He is normal weight. He is not ill-appearing, toxic-appearing or diaphoretic.  HENT:     Head: Normocephalic and atraumatic.  Cardiovascular:     Rate and Rhythm: Normal rate.  Pulmonary:     Effort: Pulmonary effort is normal. No respiratory distress.  Abdominal:     General: Abdomen is flat.     Palpations: Abdomen is soft.     Tenderness: There is abdominal tenderness in the right lower quadrant. Positive signs include McBurney's sign.  Musculoskeletal:        General: Normal range of motion.     Cervical back: Normal range of motion.  Skin:    General: Skin is warm and dry.  Neurological:     General: No focal deficit present.     Mental Status: He is alert.  Psychiatric:        Mood and Affect: Mood normal.        Behavior: Behavior normal.     Procedures  Procedures  ED Course / MDM    Medical Decision Making Amount and/or Complexity of Data Reviewed Labs: ordered. Radiology: ordered.  Risk Prescription drug management. Decision regarding hospitalization.   Patient with acute appendicitis without perforation or abscess seen on CT imaging at [facility].  Previous provider spoke with Dr. Donell Beers with general surgery, plan for transfer here.   Discussed patient with general surgery Dr. Cliffton Asters as well as Hosie Spangle, PA-C. They will see and admit the patient for surgery.  Discussed plan with patient who is understanding and in agreement. Symptoms currently  controlled.    Silva Bandy, PA-C 08/31/23 0857    Royanne Foots, DO 09/01/23 1013

## 2023-08-31 NOTE — Discharge Instructions (Signed)

## 2023-08-31 NOTE — Transfer of Care (Signed)
 Immediate Anesthesia Transfer of Care Note  Patient: Ronald Vang  Procedure(s) Performed: APPENDECTOMY, LAPAROSCOPIC  Patient Location: PACU  Anesthesia Type:General  Level of Consciousness: awake, drowsy, and responds to stimulation  Airway & Oxygen Therapy: Patient Spontanous Breathing and Patient connected to face mask oxygen  Post-op Assessment: Report given to RN and Post -op Vital signs reviewed and stable  Post vital signs: Reviewed and stable  Last Vitals:  Vitals Value Taken Time  BP 127/85 08/31/23 1705  Temp    Pulse 94 08/31/23 1707  Resp 18 08/31/23 1707  SpO2 100 % 08/31/23 1707  Vitals shown include unfiled device data.  Last Pain:  Vitals:   08/31/23 1405  TempSrc:   PainSc: 0-No pain         Complications: No notable events documented.

## 2023-08-31 NOTE — Anesthesia Preprocedure Evaluation (Addendum)
 Anesthesia Evaluation  Patient identified by MRN, date of birth, ID band Patient awake    Reviewed: Allergy & Precautions, NPO status , Patient's Chart, lab work & pertinent test results  Airway Mallampati: II  TM Distance: >3 FB Neck ROM: Full    Dental  (+) Teeth Intact, Dental Advisory Given   Pulmonary    breath sounds clear to auscultation       Cardiovascular negative cardio ROS  Rhythm:Regular Rate:Normal     Neuro/Psych    GI/Hepatic negative GI ROS, Neg liver ROS,,,  Endo/Other  negative endocrine ROS    Renal/GU negative Renal ROS     Musculoskeletal negative musculoskeletal ROS (+)    Abdominal   Peds  Hematology negative hematology ROS (+)   Anesthesia Other Findings   Reproductive/Obstetrics                             Anesthesia Physical Anesthesia Plan  ASA: 1 and emergent  Anesthesia Plan: General   Post-op Pain Management: Tylenol PO (pre-op)* and Toradol IV (intra-op)*   Induction: Intravenous  PONV Risk Score and Plan: 3 and Ondansetron, Dexamethasone and Midazolam  Airway Management Planned: Oral ETT  Additional Equipment: None  Intra-op Plan:   Post-operative Plan: Extubation in OR  Informed Consent: I have reviewed the patients History and Physical, chart, labs and discussed the procedure including the risks, benefits and alternatives for the proposed anesthesia with the patient or authorized representative who has indicated his/her understanding and acceptance.     Dental advisory given  Plan Discussed with: CRNA  Anesthesia Plan Comments:        Anesthesia Quick Evaluation

## 2023-08-31 NOTE — Anesthesia Postprocedure Evaluation (Signed)
 Anesthesia Post Note  Patient: Ronald Vang  Procedure(s) Performed: APPENDECTOMY, LAPAROSCOPIC     Patient location during evaluation: PACU Anesthesia Type: General Level of consciousness: awake and alert Pain management: pain level controlled Vital Signs Assessment: post-procedure vital signs reviewed and stable Respiratory status: spontaneous breathing, nonlabored ventilation, respiratory function stable and patient connected to nasal cannula oxygen Cardiovascular status: blood pressure returned to baseline and stable Postop Assessment: no apparent nausea or vomiting Anesthetic complications: no  No notable events documented.  Last Vitals:  Vitals:   08/31/23 1743 08/31/23 1830  BP:  (!) 147/77  Pulse: 86   Resp: (!) 22 15  Temp:  (!) 36.4 C  SpO2: 92%     Last Pain:  Vitals:   08/31/23 1743  TempSrc:   PainSc: 4                  Kennieth Rad

## 2023-08-31 NOTE — ED Notes (Signed)
 Report called and given to Abby, Consulting civil engineer at [employer]. Pt. POV Transfer

## 2023-08-31 NOTE — Op Note (Addendum)
 Ronald Vang [id number]   PRE-OPERATIVE DIAGNOSIS:  Acute appendicitis  POST-OPERATIVE DIAGNOSIS:  Acute appendicitis without perforation or abscess  PROCEDURE: Laparoscopic appendectomy  SURGEON:  Marin Olp, MD  ANESTHESIA: General endotracheal  EBL:   10 mL  DRAINS: None  SPECIMEN:  Appendix  COUNTS:  Sponge, needle and instrument counts were reported correct x2 at conclusion of the operation  DISPOSITION:  PACU in satisfactory condition  COMPLICATIONS: None  FINDINGS: Acute appendicitis without perforation or abscess. Appendectomy carried out uneventfully  DESCRIPTION:   The patient was identified & brought into the operating room. SCDs were in place and functioning. General endotracheal anesthesia was administered. Preoperative antibiotics were administered. The patient was positioned supine with left arm tucked. Hair on the abdomen was then clipped by the OR team. A foley catheter was inserted under sterile conditions. The abdomen was prepped and draped in the standard sterile fashion. A surgical timeout confirmed our plan.  A small incision was made in the supraumbilical fold. The subcutaneous tissue was dissected and the umbilical stalk identified. The stalk was grasped with a Kocher and retracted outwardly. The supraumbilical fascia was exposed and incised. Peritoneal entry was carefully made bluntly. A 0 Vicryl purse-string suture was placed and then the [facility] port was introduced into the abdomen.  CO2 insufflation commenced to . The laparoscope was inserted and confirmed no evidence of trocar site complications. The patient was then positioned in Trendelenburg. Two additional ports were placed - one in left lower quadrant and another in the suprapubic midline taking care to stay well above the bladder - 3 fingerbreadths above the pubic symphysis. The bed was then slightly tilted to place the left side down.  The abdomen is inspected.  The only abnormality  is an enlarged hyperemic appendix.  The appendix is identified in the right lower quadrant. The appendix is carefully freed from surrounding structures bluntly without difficulty.  The appendix is acutely clean appearance with hyperemia but there is no perforation or abscess.  Care was taken to avoid injuring any retroperitoneal structures. The appendix was elevated.  The base of the appendix was circumferentially dissected taking care to preserve the cecum free of injury. The base was noted to be viable and healthy appearing. The terminal ileum, cecum and ascending colon also appeared normal. The mesoappendix is then divided using the harmonic scalpel, maintaining a plane close to the appendix and terminating at our previously created window. The cut edge of the mesoappendix is observed and hemostatic.  There is 1 area of oozing from the mesoappendix that was ultimately controlled with 5 mm clips.  The base of the appendix was then stapled with a blue load, taking it flush with the cecum, but also steering clear of the ileocecal valve. The appendix was placed in an EndoBag and removed from the umbilical port site and passed off as specimen.  The right lower quadrant was conservatively irrigated. Hemostasis was noted to be achieved - taking time to inspect the ligated mesoappendix. Staple line was noted to be intact on the cecum with well formed staples and no bleeding. The right lower quadrant appeared clean and as such, no drain was placed.  The left lower quadrant and suprapubic ports were removed under direct visualization. The CO2 was exhausted from the abdomen. The umbilical fascia was then closed by tieing the 0 Vicryl suture, obliterating the fascial defect. The fascia was then palpated and noted to be completely closed. The skin of all port sites was approximated using 4-0 Monocryl  suture. The abdomen is then washed and dried. The incisions are covered with Dermabond.  He was then awakened from  anesthesia, extubated, and transferred to a stretcher for transport to PACU in satisfactory condition.

## 2023-08-31 NOTE — Anesthesia Procedure Notes (Signed)
 Procedure Name: Intubation Date/Time: 08/31/2023 4:07 PM  Performed by: Elyn Peers, CRNAPre-anesthesia Checklist: Patient identified, Emergency Drugs available, Suction available, Patient being monitored and Timeout performed Patient Re-evaluated:Patient Re-evaluated prior to induction Oxygen Delivery Method: Circle system utilized Preoxygenation: Pre-oxygenation with 100% oxygen Induction Type: IV induction Ventilation: Mask ventilation without difficulty Laryngoscope Size: Miller and 3 Grade View: Grade I Tube type: Oral Tube size: 7.0 mm Number of attempts: 1 Airway Equipment and Method: Stylet Placement Confirmation: ETT inserted through vocal cords under direct vision, positive ETCO2 and breath sounds checked- equal and bilateral Secured at: 22 cm Tube secured with: Tape Dental Injury: Teeth and Oropharynx as per pre-operative assessment

## 2023-08-31 NOTE — Discharge Summary (Signed)
 [facility] Discharge Summary   Patient ID: Ronald Vang MRN: [id number] DOB/AGE: [birth year] 28 y.o.  Admit date: 08/31/2023 Discharge date: 08/31/2023  Admitting Diagnosis: Acute appendicitis   Discharge Diagnosis S/P laparoscopic appendectomy   Consultants None   Imaging: CT ABDOMEN PELVIS W CONTRAST Result Date: 08/31/2023 CLINICAL DATA:  Right lower quadrant pain worse with movement EXAM: CT ABDOMEN AND PELVIS WITH CONTRAST TECHNIQUE: Multidetector CT imaging of the abdomen and pelvis was performed using the standard protocol following bolus administration of intravenous contrast. RADIATION DOSE REDUCTION: This exam was performed according to the departmental dose-optimization program which includes automated exposure control, adjustment of the mA and/or kV according to patient size and/or use of iterative reconstruction technique. CONTRAST:  OMNIPAQUE IOHEXOL 300 MG/ML  SOLN COMPARISON:  None Available. FINDINGS: Lower chest: No acute abnormality. Hepatobiliary: Unremarkable liver. Normal gallbladder. No biliary dilation. Pancreas: Unremarkable. Spleen: Unremarkable. Adrenals/Urinary Tract: Normal adrenal glands. No urinary calculi or hydronephrosis. Bladder is unremarkable. Stomach/Bowel: Normal caliber large and small bowel. No bowel wall thickening. Stomach is within normal limits. Dilated appendix measuring 10 mm. Adjacent inflammatory stranding and trace free fluid. No abscess or perforation. Vascular/Lymphatic: No significant vascular findings are present. No enlarged abdominal or pelvic lymph nodes. Reproductive: Unremarkable. Other: No abscess or free intraperitoneal air. Musculoskeletal: No acute fracture. IMPRESSION: Acute uncomplicated appendicitis. Electronically Signed   By: Minerva Fester M.D.   On: 08/31/2023 03:38    Procedures Dr. Marin Olp (08/31/23) - Laparoscopic Appendectomy  Hospital Course:  Patient is a 28 year old male who presented to  the ED with abdominal pain.  Workup showed acute appendicitis.  Patient was admitted and underwent procedure listed above.  Tolerated procedure well and was discharged from the PACU. Follow up as outlined below.   I or a member of my team have reviewed this patient in the Controlled Substance Database.   Allergies as of 08/31/2023   No Known Allergies      Medication List     TAKE these medications    azelastine 0.1 % nasal spray Commonly known as: ASTELIN TAKE 1-2 SPRAYS TWICE A DAY AS NEEDED FOR RUNNY NOSE.   benzonatate 100 MG capsule Commonly known as: TESSALON Take 2 capsules (200 mg total) by mouth 3 (three) times daily as needed for cough.   ZyrTEC Allergy 10 MG tablet Generic drug: cetirizine Take 10 mg by mouth daily.   cetirizine 10 MG tablet Commonly known as: ZYRTEC Take 1 tablet (10 mg total) by mouth daily.   fluticasone 50 MCG/ACT nasal spray Commonly known as: Flonase Place 2 sprays into both nostrils daily.   HYDROcodone-acetaminophen 5-325 MG tablet Commonly known as: NORCO/VICODIN Take 1 tablet by mouth every 6 (six) hours as needed for moderate pain (pain score 4-6).   ibuprofen 200 MG tablet Commonly known as: ADVIL Take 400 mg by mouth every 6 (six) hours as needed.   montelukast 10 MG tablet Commonly known as: SINGULAIR TAKE 1 TABLET BY MOUTH EVERYDAY AT BEDTIME   oseltamivir 75 MG capsule Commonly known as: TAMIFLU Take 1 capsule (75 mg total) by mouth 2 (two) times daily.          Follow-up Information     [facility]zis, Hedda Slade, [place]. Go on 09/30/2023.   Specialty: General Surgery Why: at 1:45 PM for post-operative follow up. please arrive 30 minutes early. Contact information: [address] [place] [address] [phone]  Signed: Juliet Rude , [facility] Surgery 08/31/2023, 4:55 PM Please see Amion for pager number during day hours 7:00am-4:30pm

## 2023-09-01 ENCOUNTER — Encounter (HOSPITAL_COMMUNITY): Payer: Self-pay | Admitting: Surgery

## 2023-09-02 LAB — SURGICAL PATHOLOGY

## 2023-10-28 ENCOUNTER — Ambulatory Visit: Admitting: Family Medicine

## 2023-10-28 ENCOUNTER — Encounter: Payer: Self-pay | Admitting: Family Medicine

## 2023-10-28 ENCOUNTER — Other Ambulatory Visit: Payer: Self-pay | Admitting: Allergy

## 2023-10-28 VITALS — BP 108/82 | HR 94 | Temp 98.4°F | Ht 63.0 in | Wt 184.0 lb

## 2023-10-28 DIAGNOSIS — J3089 Other allergic rhinitis: Secondary | ICD-10-CM | POA: Diagnosis not present

## 2023-10-28 DIAGNOSIS — H1013 Acute atopic conjunctivitis, bilateral: Secondary | ICD-10-CM | POA: Diagnosis not present

## 2023-10-28 DIAGNOSIS — J302 Other seasonal allergic rhinitis: Secondary | ICD-10-CM | POA: Diagnosis not present

## 2023-10-28 DIAGNOSIS — H6993 Unspecified Eustachian tube disorder, bilateral: Secondary | ICD-10-CM | POA: Diagnosis not present

## 2023-10-28 DIAGNOSIS — H1010 Acute atopic conjunctivitis, unspecified eye: Secondary | ICD-10-CM

## 2023-10-28 MED ORDER — MONTELUKAST SODIUM 10 MG PO TABS: ORAL_TABLET | ORAL | 3 refills | Status: AC

## 2023-10-28 MED ORDER — CARBINOXAMINE MALEATE 4 MG PO TABS: ORAL_TABLET | ORAL | 5 refills | Status: AC

## 2023-10-28 NOTE — Patient Instructions (Addendum)
 Allergic rhinitis/eustachian tube dysfunction Continue allergen avoidance measures directed toward grass pollen, tree pollen, weed pollen, ragweed pollen, dust mite, and mold as listed below Continue montelukast  10 mg once a day to control allergy  symptoms Begin carbinoxamine  4 mg tablets.  Take 1 or 2 in the morning and 1 or 2 in the evening.  If this makes you too sleepy, decrease your dose.Remember to rotate to a different antihistamine about every 3 months. Some examples of over the counter antihistamines include Zyrtec  (cetirizine ), Xyzal (levocetirizine), Allegra (fexofenadine), and Claritin (loratidine).   Continue azelastine  2 sprays in each nostril once a day as needed for runny nose Continue Flonase  2 sprays in each nostril once a day as needed for stuffy nose.  In the right nostril, point the applicator out toward the right ear. In the left nostril, point the applicator out toward the left ear Consider saline nasal rinses as needed for nasal symptoms. Use this before any medicated nasal sprays for best result Consider allergen immunotherapy if your symptoms are not well-controlled with the treatment plan as listed below  Allergic conjunctivitis Some over the counter eye drops include Pataday one drop in each eye once a day as needed for red, itchy eyes OR Zaditor one drop in each eye twice a day as needed for red itchy eyes. Avoid eye drops that say red eye relief as they may contain medications that dry out your eyes.   Call the clinic if this treatment plan is not working well for you.  Follow up in 1 year or sooner if needed.  Reducing Pollen Exposure The American Academy of Allergy , Asthma and Immunology suggests the following steps to reduce your exposure to pollen during allergy  seasons. Do not hang sheets or clothing out to dry; pollen may collect on these items. Do not mow lawns or spend time around freshly cut grass; mowing stirs up pollen. Keep windows closed at night.  Keep  car windows closed while driving. Minimize morning activities outdoors, a time when pollen counts are usually at their highest. Stay indoors as much as possible when pollen counts or humidity is high and on windy days when pollen tends to remain in the air longer. Use air conditioning when possible.  Many air conditioners have filters that trap the pollen spores. Use a HEPA room air filter to remove pollen form the indoor air you breathe.   Control of Dust Mite Allergen Dust mites play a major role in allergic asthma and rhinitis. They occur in environments with high humidity wherever human skin is found. Dust mites absorb humidity from the atmosphere (ie, they do not drink) and feed on organic matter (including shed human and animal skin). Dust mites are a microscopic type of insect that you cannot see with the naked eye. High levels of dust mites have been detected from mattresses, pillows, carpets, upholstered furniture, bed covers, clothes, soft toys and any woven material. The principal allergen of the dust mite is found in its feces. A gram of dust may contain 1,000 mites and 250,000 fecal particles. Mite antigen is easily measured in the air during house cleaning activities. Dust mites do not bite and do not cause harm to humans, other than by triggering allergies/asthma.  Ways to decrease your exposure to dust mites in your home:  1. Encase mattresses, box springs and pillows with a mite-impermeable barrier or cover  2. Wash sheets, blankets and drapes weekly in hot water (130 F) with detergent and dry them in a dryer  on the hot setting.  3. Have the room cleaned frequently with a vacuum cleaner and a damp dust-mop. For carpeting or rugs, vacuuming with a vacuum cleaner equipped with a high-efficiency particulate air (HEPA) filter. The dust mite allergic individual should not be in a room which is being cleaned and should wait 1 hour after cleaning before going into the room.  4. Do not  sleep on upholstered furniture (eg, couches).  5. If possible removing carpeting, upholstered furniture and drapery from the home is ideal. Horizontal blinds should be eliminated in the rooms where the person spends the most time (bedroom, study, television room). Washable vinyl, roller-type shades are optimal.  6. Remove all non-washable stuffed toys from the bedroom. Wash stuffed toys weekly like sheets and blankets above.  7. Reduce indoor humidity to less than 50%. Inexpensive humidity monitors can be purchased at most hardware stores. Do not use a humidifier as can make the problem worse and are not recommended.  Control of Mold Allergen Mold and fungi can grow on a variety of surfaces provided certain temperature and moisture conditions exist.  Outdoor molds grow on plants, decaying vegetation and soil.  The major outdoor mold, Alternaria and Cladosporium, are found in very high numbers during hot and dry conditions.  Generally, a late Summer - Fall peak is seen for common outdoor fungal spores.  Rain will temporarily lower outdoor mold spore count, but counts rise rapidly when the rainy period ends.  The most important indoor molds are Aspergillus and Penicillium.  Dark, humid and poorly ventilated basements are ideal sites for mold growth.  The next most common sites of mold growth are the bathroom and the kitchen.  Outdoor [employer] Use air conditioning and keep windows closed Avoid exposure to decaying vegetation. Avoid leaf raking. Avoid grain handling. Consider wearing a face mask if working in moldy areas.  Indoor Mold Control Maintain humidity below 50%. Clean washable surfaces with 5% bleach solution. Remove sources e.g. Contaminated carpets.

## 2023-10-28 NOTE — Progress Notes (Signed)
 [address] [place] [address] Dept: [phone]  FOLLOW UP NOTE  Patient ID: Ronald Vang, male    DOB: 10-Feb-1996  Age: 28 y.o. 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  Has been blowing his nose so much that his ears have popped and he feels a lot of pressure.)  HPI [name] is a 28 year old male who presents to the clinic for follow-up visit.  He was last seen in this clinic on 10/08/2022 by Marinus Sic, FNP, for evaluation of allergic rhinitis and allergic conjunctivitis.  Chart review indicates he had recent acute appendicitis with appendectomy on 08/31/2023.  At today's visit, he reports his allergic rhinitis has been poorly controlled with symptoms including clear rhinorrhea, nasal congestion, sneezing, and postnasal drainage.  He reports bilateral ear fullness and popping when he blows his nose.  He continues montelukast  10 mg once a day and has been using an antihistamine with a nasal congestion daily.  He reports that he has tried cetirizine  D and Allegra-D without relief of nasal symptoms.  He recently started using Flonase  with mild relief of symptoms.  His last environmental allergy  skin testing was on 08/18/2018 and was positive to grass pollen, tree pollen, weed pollen, ragweed pollen, dust mite, and mold.  He is not interested in allergen immunotherapy at this time due to his irregular schedule.  Allergic conjunctivitis is reported as moderately well-controlled with occasional red and itchy eyes for which he continues olopatadine with relief of symptoms.  His current medications are listed in the chart.  Drug Allergies:  No Known Allergies  Physical Exam: BP 108/82   Pulse 94   Temp 98.4 F (36.9 C)   Ht 5\' 3"  (1.6 m)   Wt 184 lb (83.5 kg)   SpO2 98%   BMI 32.59 kg/m     Physical Exam Vitals reviewed.  Constitutional:      Appearance: Normal appearance.  HENT:     Head: Normocephalic and atraumatic.     Right Ear: Tympanic membrane normal.     Left Ear: Tympanic membrane normal.     Nose:     Comments: Bilateral nares edematous and pale with right nostril more than left.  Pharynx slightly erythematous with thin clear nasal drainage.  Ears normal.  Eyes normal. Eyes:     Conjunctiva/sclera: Conjunctivae normal.  Cardiovascular:     Rate and Rhythm: Normal rate and regular rhythm.     Heart sounds: Normal heart sounds. No murmur heard. Pulmonary:     Effort: Pulmonary effort is normal.     Breath sounds: Normal breath sounds.     Comments: Lungs clear to auscultation Musculoskeletal:        General: Normal range of motion.     Cervical back: Normal range of motion and neck supple.  Skin:    General: Skin is warm and dry.  Neurological:     Mental Status: He is alert and oriented to person, place, and time.  Psychiatric:        Mood and Affect: Mood normal.        Behavior: Behavior normal.        Thought Content: Thought content normal.        Judgment: Judgment normal.     Assessment and Plan: 1. Seasonal and perennial allergic rhinitis  2. Seasonal allergic conjunctivitis   3. Dysfunction of both eustachian tubes     Meds ordered this encounter  Medications   Carbinoxamine  Maleate 4 MG TABS    Sig: Take 1 to 2 tablets once or twice a day if needed for nasal symptoms    Dispense:  120 tablet    Refill:  5   montelukast  (SINGULAIR ) 10 MG tablet    Sig: TAKE 1 TABLET BY MOUTH EVERYDAY AT BEDTIME    Dispense:  90 tablet    Refill:  3    Patient Instructions  Allergic rhinitis/eustachian tube dysfunction Continue allergen avoidance measures directed toward grass pollen, tree pollen, weed pollen, ragweed pollen, dust mite, and mold as listed below Continue montelukast  10 mg once a day to control allergy  symptoms Begin  carbinoxamine  4 mg tablets.  Take 1 or 2 in the morning and 1 or 2 in the evening.  If this makes you too sleepy, decrease your dose.Remember to rotate to a different antihistamine about every 3 months. Some examples of over the counter antihistamines include Zyrtec  (cetirizine ), Xyzal (levocetirizine), Allegra (fexofenadine), and Claritin (loratidine).   Continue azelastine  2 sprays in each nostril once a day as needed for runny nose Continue Flonase  2 sprays in each nostril once a day as needed for stuffy nose.  In the right nostril, point the applicator out toward the right ear. In the left nostril, point the applicator out toward the left ear Consider saline nasal rinses as needed for nasal symptoms. Use this before any medicated nasal sprays for best result Consider allergen immunotherapy if your symptoms are not well-controlled with the treatment plan as listed below  Allergic conjunctivitis Some over the counter eye drops include Pataday one drop in each eye once a day as needed for red, itchy eyes OR Zaditor one drop in each eye twice a day as needed for red itchy eyes. Avoid eye drops that say red eye relief as they may contain medications that dry out your eyes.   Call the clinic if this treatment plan is not working well for you.  Follow up in 1 year or sooner if needed.  Return in about 1 year (around 10/27/2024), or if symptoms worsen or fail to improve.    Thank you for the opportunity to care for this patient.  Please do not hesitate to contact me with questions.  Marinus Sic, FNP [facility]
